# Patient Record
Sex: Female | Born: 1980 | Hispanic: Yes | Marital: Married | Smoking: Never smoker
Health system: Southern US, Community
[De-identification: ages and names within clinical notes are randomized; demographics above are authoritative.]

## PROBLEM LIST (undated history)

## (undated) DIAGNOSIS — R51 Headache: Secondary | ICD-10-CM

## (undated) DIAGNOSIS — K219 Gastro-esophageal reflux disease without esophagitis: Secondary | ICD-10-CM

## (undated) DIAGNOSIS — N83209 Unspecified ovarian cyst, unspecified side: Secondary | ICD-10-CM

## (undated) DIAGNOSIS — E785 Hyperlipidemia, unspecified: Secondary | ICD-10-CM

## (undated) DIAGNOSIS — T8859XA Other complications of anesthesia, initial encounter: Secondary | ICD-10-CM

## (undated) DIAGNOSIS — T4145XA Adverse effect of unspecified anesthetic, initial encounter: Secondary | ICD-10-CM

## (undated) HISTORY — DX: Hyperlipidemia, unspecified: E78.5

---

## 2007-09-20 HISTORY — PX: OTHER SURGICAL HISTORY: SHX169

## 2010-10-18 HISTORY — PX: ECTOPIC PREGNANCY SURGERY: SHX613

## 2010-10-18 HISTORY — PX: OTHER SURGICAL HISTORY: SHX169

## 2011-11-05 ENCOUNTER — Other Ambulatory Visit: Payer: Self-pay | Admitting: Specialist

## 2011-11-05 DIAGNOSIS — R102 Pelvic and perineal pain: Secondary | ICD-10-CM

## 2011-11-07 ENCOUNTER — Other Ambulatory Visit: Payer: Self-pay

## 2011-11-13 ENCOUNTER — Ambulatory Visit
Admission: RE | Admit: 2011-11-13 | Discharge: 2011-11-13 | Disposition: A | Discharge status: Home / Self Care | Payer: Medicaid Other | Source: Ambulatory Visit | Attending: Specialist | Admitting: Specialist

## 2011-11-13 DIAGNOSIS — R102 Pelvic and perineal pain: Secondary | ICD-10-CM

## 2012-01-02 ENCOUNTER — Encounter (INDEPENDENT_AMBULATORY_CARE_PROVIDER_SITE_OTHER): Payer: Self-pay

## 2012-01-14 ENCOUNTER — Other Ambulatory Visit (INDEPENDENT_AMBULATORY_CARE_PROVIDER_SITE_OTHER): Payer: Self-pay | Admitting: Surgery

## 2012-01-14 ENCOUNTER — Encounter (INDEPENDENT_AMBULATORY_CARE_PROVIDER_SITE_OTHER): Payer: Self-pay | Admitting: Surgery

## 2012-01-14 ENCOUNTER — Ambulatory Visit (INDEPENDENT_AMBULATORY_CARE_PROVIDER_SITE_OTHER): Payer: Medicaid Other | Admitting: Surgery

## 2012-01-14 VITALS — BP 106/86 | HR 80 | Temp 98.6°F | Resp 14 | Ht 61.0 in | Wt 161.4 lb

## 2012-01-14 DIAGNOSIS — K432 Incisional hernia without obstruction or gangrene: Secondary | ICD-10-CM

## 2012-01-14 NOTE — Patient Instructions (Signed)
Hernia (Hernia) Una hernia ocurre cuando un rgano interno protruye a travs de un punto dbil de los msculos de la pared muscular abdominal (del vientre). Se producen con mayor frecuencia en la ingle y alrededor del ombligo. Generalmente puede volver a Museum/gallery exhibitions officer (reducirse). La mayor parte de las hernias tienden a Theme park manager con Museum/gallery conservator. Algunas hernias abdominales pueden atascarse en la abertura (hernias irreductibles o hernia encarcelada) y no pueden reducirse. Una hernia abdominal irreducible que est ligeramente apretada en la abertura, corre el riesgo de daar el flujo de sangre (hernia estrangulada). Una hernia estrangulada es una emergencia mdica. Debido al riesgo que se corre en caso de hernia irreducible o estrangulada, se recomienda la ciruga para repararla. CAUSAS  Levantar peso excesivo.   Mucha tos.   Tensin al ir de cuerpo.   Durante la ciruga abdominal se realiza un corte (incisin).  INSTRUCCIONES PARA EL CUIDADO DOMICILIARIO  No es necesario hacer reposo en cama. Puede continuar con sus actividades habituales.   Evite levantar peso (ms de 10 libras o 4,5 Kg) o hacer esfuerzos.   Tos con suavidad. Si actualmente usted fuma, es el momento de abandonar el hbito. Hasta el procedimiento quirrgico ms perfecto puede malograrse si se hace fuerza contnua para toser. Aunque su hernia no haya sido reparada, la tos puede agravar el problema.   No use nada apretado sobre la hernia. No trate de mantenerla adentro con un vendaje externo o un braguero. Puede lesionar el contenido abdominal si los aprieta dentro del saco de la hernia.   Consumir una dieta normal.   Evite la constipacin. Si hace mucha fuerza aumentar el tamao de la hernia y podr daarse la reparacin. Si no lo logra slo con la dieta, puede usar laxantes.  SOLICITE ATENCIN MDICA DE INMEDIATO SI:  Tiene fiebre.   Presenta un dolor abdominal cada vez ms intenso.   Si tiene Programme researcher, broadcasting/film/video  (nuseas) y vmitos.   La hernia se ha atascado fuera del abdomen, se ve descolorida, se siente dura o le duele.   Observa cambios en el hbito intestinal o en la hernia, lo que no es habitual en usted.   El dolor o la hinchazn alrededor de la hernia [place].   No puede volver a Electrical engineer hernia en su lugar ejerciendo una presin suave mientras se encuentra recostado.  EST SEGURO QUE:   Comprende las instrucciones para el alta mdica.   Controlar su enfermedad.   Solicitar atencin mdica de inmediato segn las indicaciones.  Document Released: 08/05/2005 Document Revised: 07/25/2011 [facility] Patient Information 2012 [place], [place]. Hernia, reparacin con laparoscopa (Hernia Repair with Laparoscope) Una hernia ocurre cuando un rgano interno protruye a travs de un punto dbil de los msculos de la pared muscular abdominal (del vientre). Se producen con mayor frecuencia en la ingle y alrededor del ombligo. Tambin pueden producirse a travs de una incisin (corte realizado por el cirujano) despus de una operacin abdominal. Las causas de hernia son:  Levantar objetos pesados.   Tos prolongada.   Hacer fuerza para mover el intestino.  Generalmente puede volver a Museum/gallery exhibitions officer (reducirse). La mayor parte de las hernias tienden a Theme park manager con Museum/gallery conservator. Se producen algunos problemas cuando alguna parte del contenido abdominal se atasca en la abertura y el flujo de sangre se bloquea o se ve dificultado (hernia encarcelada). Debido a Clinical biochemist, es necesario someterse a una ciruga para reparar la hernia. La hernia se reparar utilizando un laparoscopio La ciruga laparoscpica  es un tipo de [place] mnimamente invasora. No implica ningn corte quirrgico (incisin) en la piel. El laparoscopio es un dispositivo similar a un telescopio que utiliza varillas y lentes. Se conecta a una cmara de vdeo y a una fuente de luz, con la que el profesional puede ver claramente la zona  de la operacin. El instrumento se inserta por una abertura en la piel de  to  inch (5 mm or 10 mm), en ciertos puntos especficos. Se crea un espacio de visualizacin y manipulacin, insuflando una pequea cantidad de dixido de carbono en la cavidad abdominal. El abdomen es inflado (insuflado) como un globo. Esto eleve la pared abdominal por arriba de los rganos internos como si fuera una bveda. El dixido de carbono es un gas presente en el organismo humano y puede ser absorbido por los tejidos y eliminado a travs del sistema respiratorio. Una vez que se ha llevado a cabo la reparacin, las pequeas incisiones se cerrarn, ya sea con suturas (puntos) o grapas (igual a las que se usan para el papel, pero aqu se trata de [employer] bordes de la piel unidos). INFORME AL PROFESIONAL SOBRE LOS SIGUIENTES PUNTOS:  Alergias.   Medicamentos que toma, incluyendo hierbas, gotas oftlmicas, medicamentos de venta libre y cremas.   Uso de esteroides (por va oral o cremas).   Problemas anteriores debido a anestsicos o a Patent examiner.   Posibilidad de embarazo, si correspondiera.   Antecedentes de haber tenido cogulos sanguneos (tromboflebitis).   Antecedentes de hemorragias o problemas sanguneos.   Cirugas previas.   Otros problemas de [place].  ANTES DEL PROCEDIMIENTO La laparoscopa podr llevarse a cabo tanto en el hospital como en un consultorio. Podrn administrarle sedante suave que lo ayudar a relajarse antes y durante el procedimiento. Una vez en la sala de operaciones, le darn una anestesia general que lo har dormir (a menos que usted y el profesional elijan otro tipo de anestesia).  DESPUS DEL PROCEDIMIENTO Luego del procedimiento ser llevado a una sala de recuperacin. Segn el tipo de hernia que se haya reparado, deber quedarse en el hospital o se Higher education careers adviser a Charity fundraiser. Con este procedimiento sentir menos dolor y habr Market researcher. Esto por lo  general resulta en una recuperacin ms rpida y un menor riesgo de infeccin. INSTRUCCIONES PARA EL CUIDADO DOMICILIARIO  No es necesario hacer reposo en cama. Puede continuar con sus actividades normales pero evite levantar objetos pesados (ms de 5 kg) y el estreimiento.   Tosa con suavidad. Si es fumador, es mejor abandonar el hbito, ya que an despus de reparada, la hernia puede volver a aparecer con la tensin continua de la tos.   Evite conducir hasta que el cirujano lo autorice.   No hay restricciones en la dieta, a menos que le indiquen otra cosa.   TOME TODOS LOS MEDICAMENTOS TAL COMO SE LE INDIC.   Utilice los medicamentos de venta libre o de prescripcin para Chief Technology Officer, Environmental health practitioner o la [place], segn se lo indique el profesional que lo asiste.  SOLICITE ATENCIN MDICA SI:  Aumenta el dolor abdominal o en las incisiones.   La hemorragia en las incisiones es ms que una pequea [place].   Si se siente confundido o muy dbil.   Siente escalofros o la temperatura oral se eleva por encima de 102 F (38.9 C) en forma inexplicable.   Presenta enrojecimiento, hinchazn o aumento del dolor en la herida.   Aparece pus en la  herida.   Un olor ftido proviene de la herida o del vendaje.  SOLICITE ATENCIN MDICA DE INMEDIATO SI:  Aparece una erupcin cutnea.   Presenta dificultades respiratorias.   Tiene algn problema alrgico.  EST SEGURO QUE:   Comprende las instrucciones para el alta mdica.   Controlar su enfermedad.   Solicitar atencin mdica de inmediato segn las indicaciones.  Document Released: 08/05/2005 Document Revised: 07/25/2011 [facility] Patient Information 2012 [place], [place]. Reparacin de la hernia, cuidados posteriores a la ciruga (Hernia Repair, Care After) Por favor lea detenidamente las siguientes indicaciones. Estas instrucciones le darn informacin acerca de qu hacer al [employer] hospital. El profesional que la asiste podr darle  instrucciones especficas. Si tiene problemas o surgen preguntas luego de recibir el alta, por favor comunquese con su mdico. CUIDADOS EN EL HOGAR  Podr tener cambios al mover el intestino.   Usted puede tener materia fecal blanda o acuosas.   Puede ser que no pueda ir de cuerpo.   Los intestinos volvern lentamente a la normalidad.   Puede ser que no sienta deseos de comer. Si hay un alimento que le produce nuseas, no lo coma. Coma pequeas porciones 4 a 6 veces al Geophysical data processor de 3 comidas importantes.   No beba gaseosas. Esto le producir gases.   No beba alcohol.   No levante objetos que pesen ms de 10 libras (4,54 Kg.). Esto es aproximadamente el peso de un galn de [place] (3, 780 l).   No haga nada que le produzca cansancio durante por lo menos 6 semanas.   No moje la herida [employer].   Durante este tiempo puede tomar un bao con esponja.   Pasados los 2 [place] tomar [place]. Luego de la ducha, seque la herida con una toalla dando palmaditas. No la frote.   Para hombres: Es posible que le hayan entregado un suspensor (soporte escrotal) antes de salir del hospital. Esto mantiene el escroto y los testculos ms cerca de su cuerpo por lo tanto no hay presin sobre la herida. Use el suspensor hasta que su mdico le indique que no lo necesita ms.  SOLICITE AYUDA DE INMEDIATO SI:  La materia fecal es acuosa, o no puede ir de cuerpo durante ms de [employer].   Siente nuseas o vomita mas de 2  3 veces.   Tiene fiebre de 102 F (38.9 C) o ms.   Observa enrojecimiento o hinchazn alrededor de la herida.   Observa un lquido amarillento o verdoso, o pus que proviene de la herida.   Observa un bulto o protuberancia en la parte inferior del abdomen o cerca de su ingle.   Aparece una erupcin, dificultad para respirar o cualquier otro sntoma provocado por el medicamento que toma.  ASEGRESE QUE:   Comprende estas instrucciones.   Controlar su enfermedad.     Solicitar ayuda inmediatamente si no mejora o si empeora.  Document Released: 11/01/2008 Document Revised: 07/25/2011 [facility] Patient Information 2012 [place], [place].

## 2012-01-14 NOTE — Progress Notes (Signed)
Patient ID: Stoney Bang, female   DOB: 09-Oct-1980, 31 y.o.   MRN: [id number]  Chief Complaint  Patient presents with  . New Evaluation    New Pt. Eval Abd Wall hernia    HPI Melinda Baxter is a 31 y.o. female.   HPI Patient sent at the request of Dr. Jackelyn Knife due to incisional hernia. She has had it for 7 months. She is seeing her gynecologist for pelvic pain she was sent to discuss hernia repair since she may require laparoscopy to evaluate pelvic pain. She speaks little and wish but is accompanied by family who translates. No history of nausea vomiting. No history of constipation. The hernia slides and it slides out. Is located just to the right of her lower midline abdominal incision. It bulges when she coughs.  Past Medical History  Diagnosis Date  . Asthma   . Hyperlipidemia     Past Surgical History  Procedure Date  . Salpingo-ooprectomy of left side 09/2007  . Ectopic pregnancy surgery 10/2010  . Salpingo-oophrectomy of right side 10/2010    History reviewed. No pertinent family history.  Social History History  Substance Use Topics  . Smoking status: Never Smoker   . Smokeless tobacco: Never Used  . Alcohol Use: No    No Known Allergies  No current outpatient prescriptions on file.    Review of Systems Review of Systems  Constitutional: Negative for fever, chills and unexpected weight change.  HENT: Negative for hearing loss, congestion, sore throat, trouble swallowing and voice change.   Eyes: Negative for visual disturbance.  Respiratory: Negative for cough and wheezing.   Cardiovascular: Negative for chest pain, palpitations and leg swelling.  Gastrointestinal: Negative for nausea, vomiting, abdominal pain, diarrhea, constipation, blood in stool, abdominal distention and anal bleeding.  Genitourinary: Negative for hematuria, vaginal bleeding and difficulty urinating.  Musculoskeletal: Negative for arthralgias.  Skin: Negative for rash and  wound.  Neurological: Negative for seizures, syncope and headaches.  Hematological: Negative for adenopathy. Does not bruise/bleed easily.  Psychiatric/Behavioral: Negative for confusion.    Blood pressure 106/86, pulse 80, temperature 98.6 F (37 C), temperature source Temporal, resp. rate 14, height 5\' 1"  (1.549 m), weight 161 lb 6.4 oz (73.211 kg).  Physical Exam Physical Exam  Constitutional: She appears well-developed and well-nourished.  HENT:  Head: Normocephalic and atraumatic.  Eyes: EOM are normal. Pupils are equal, round, and reactive to light.  Neck: Normal range of motion. Neck supple.  Cardiovascular: Normal rate and regular rhythm.   Pulmonary/Chest: Effort normal and breath sounds normal.  Abdominal:    Musculoskeletal: Normal range of motion.  Neurological: She is alert.  Skin: Skin is warm and dry.  Psychiatric: She has a normal mood and affect. Her behavior is normal. Judgment and thought content normal.    Data Reviewed Pelvic Ultrasound  Uterus normal both ovaries absent   Assessment    Incisional hernia 3 cm  Pelvic pain    Plan    Recommend laparoscopic repair of incisional hernia. Pelvic laparoscopy can be performed by Dr. Jackelyn Knife. I tried to contact him today but could only leave a message therefore I will wait to hear back from him prior to scheduling.  The risk of hernia repair include bleeding,  Infection,   Recurrence of the hernia,  Mesh use, chronic pain,  Organ injury,  Bowel injury,  Bladder injury,   nerve injury with numbness around the incision,  Death,  and worsening of preexisting  medical problems.  The alternatives  to surgery have been discussed as well..  Long term expectations of both operative and non operative treatments have been discussed.   The patient agrees to proceed.       Kaimen Peine A. 01/14/2012, 11:59 AM

## 2012-02-12 ENCOUNTER — Encounter (HOSPITAL_COMMUNITY): Payer: Self-pay | Admitting: Pharmacy Technician

## 2012-02-13 ENCOUNTER — Encounter (HOSPITAL_COMMUNITY): Payer: Self-pay

## 2012-02-13 ENCOUNTER — Encounter (HOSPITAL_COMMUNITY)
Admission: RE | Admit: 2012-02-13 | Discharge: 2012-02-13 | Disposition: A | Discharge status: Home / Self Care | Payer: Medicaid Other | Source: Ambulatory Visit | Attending: Surgery | Admitting: Surgery

## 2012-02-13 ENCOUNTER — Other Ambulatory Visit: Payer: Self-pay

## 2012-02-13 ENCOUNTER — Ambulatory Visit (HOSPITAL_COMMUNITY)
Admission: RE | Admit: 2012-02-13 | Discharge: 2012-02-13 | Disposition: A | Discharge status: Home / Self Care | Payer: Medicaid Other | Source: Ambulatory Visit | Attending: Surgery | Admitting: Surgery

## 2012-02-13 DIAGNOSIS — Z01810 Encounter for preprocedural cardiovascular examination: Secondary | ICD-10-CM | POA: Insufficient documentation

## 2012-02-13 DIAGNOSIS — Z01812 Encounter for preprocedural laboratory examination: Secondary | ICD-10-CM | POA: Insufficient documentation

## 2012-02-13 DIAGNOSIS — M6289 Other specified disorders of muscle: Secondary | ICD-10-CM | POA: Insufficient documentation

## 2012-02-13 HISTORY — DX: Headache: R51

## 2012-02-13 HISTORY — DX: Unspecified ovarian cyst, unspecified side: N83.209

## 2012-02-13 HISTORY — DX: Other complications of anesthesia, initial encounter: T88.59XA

## 2012-02-13 HISTORY — DX: Gastro-esophageal reflux disease without esophagitis: K21.9

## 2012-02-13 HISTORY — DX: Adverse effect of unspecified anesthetic, initial encounter: T41.45XA

## 2012-02-13 LAB — COMPREHENSIVE METABOLIC PANEL
AST: 15 U/L (ref 0–37)
Albumin: 4 g/dL (ref 3.5–5.2)
BUN: 13 mg/dL (ref 6–23)
Calcium: 9.9 mg/dL (ref 8.4–10.5)
Creatinine, Ser: 0.45 mg/dL — ABNORMAL LOW (ref 0.50–1.10)
Total Bilirubin: 0.2 mg/dL — ABNORMAL LOW (ref 0.3–1.2)
Total Protein: 7.6 g/dL (ref 6.0–8.3)

## 2012-02-13 LAB — DIFFERENTIAL
Basophils Relative: 0 % (ref 0–1)
Lymphocytes Relative: 36 % (ref 12–46)
Lymphs Abs: 2.4 10*3/uL (ref 0.7–4.0)
Monocytes Absolute: 0.6 10*3/uL (ref 0.1–1.0)
Monocytes Relative: 9 % (ref 3–12)
Neutro Abs: 3.5 10*3/uL (ref 1.7–7.7)
Neutrophils Relative %: 52 % (ref 43–77)

## 2012-02-13 LAB — CBC
HCT: 37 % (ref 36.0–46.0)
MCH: 29.7 pg (ref 26.0–34.0)
MCHC: 34.3 g/dL (ref 30.0–36.0)
MCV: 86.4 fL (ref 78.0–100.0)
Platelets: 173 10*3/uL (ref 150–400)
RDW: 12.6 % (ref 11.5–15.5)
WBC: 6.7 10*3/uL (ref 4.0–10.5)

## 2012-02-13 LAB — HCG, SERUM, QUALITATIVE: Preg, Serum: NEGATIVE

## 2012-02-13 LAB — SURGICAL PCR SCREEN: Staphylococcus aureus: NEGATIVE

## 2012-02-13 NOTE — Patient Instructions (Addendum)
20 Melinda Baxter  02/13/2012   Your procedure is scheduled on:  02/24/12 0730am-0930am  Report to [facility] at 0530 AM.  Call this number if you have problems the morning of surgery: [phone]   Remember:   Do not eat food:After Midnight.  May have clear liquids:until Midnight .  Marland Kitchen  Take these medicines the morning of surgery with A SIP OF WATER:   Do not wear jewelry, make-up or nail polish.  Do not wear lotions, powders, or perfumes..  Do not shave 48 hours prior to surgery.  Do not bring valuables to the hospital.  Contacts, dentures or bridgework may not be worn into surgery.     Patients discharged the day of surgery will not be allowed to drive home.  Name and phone number of your driver:   Special Instructions: CHG Shower Use Special Wash: 1/2 bottle night before surgery and 1/2 bottle morning of surgery. shower chin to toes with CHG.  Wash face and private parts with regular soap.     Please read over the following fact sheets that you were given: MRSA Information, coughign and deep breathing exercises, leg exercises

## 2012-02-13 NOTE — Pre-Procedure Instructions (Signed)
Teach Back method used for teaching for preop appointment

## 2012-02-14 ENCOUNTER — Other Ambulatory Visit: Payer: Self-pay | Admitting: Obstetrics and Gynecology

## 2012-02-21 NOTE — Progress Notes (Signed)
Spoke with Melinda Baxter re: /dr Meisinger has put in orders in for consent for procedure but this procedure has not been booked with OR. Called and left a message at Office for them to call us back ASAP.  Melinda Baxter her recommendation is to get consent signed for this procedure per physician order.

## 2012-02-24 ENCOUNTER — Encounter (HOSPITAL_COMMUNITY): Payer: Self-pay | Admitting: Certified Registered Nurse Anesthetist

## 2012-02-24 ENCOUNTER — Encounter (HOSPITAL_COMMUNITY): Payer: Self-pay | Admitting: Surgery

## 2012-02-24 ENCOUNTER — Ambulatory Visit (HOSPITAL_COMMUNITY): Payer: Medicaid Other | Admitting: Certified Registered Nurse Anesthetist

## 2012-02-24 ENCOUNTER — Encounter (HOSPITAL_COMMUNITY): Payer: Self-pay | Admitting: *Deleted

## 2012-02-24 ENCOUNTER — Encounter (HOSPITAL_COMMUNITY)
Admission: RE | Disposition: A | Discharge status: Home / Self Care | Payer: Self-pay | Source: Ambulatory Visit | Attending: Surgery

## 2012-02-24 ENCOUNTER — Inpatient Hospital Stay (HOSPITAL_COMMUNITY)
Admission: RE | Admit: 2012-02-24 | Discharge: 2012-02-27 | DRG: 337 | Disposition: A | Discharge status: Home / Self Care | Payer: Medicaid Other | Source: Ambulatory Visit | Attending: Surgery | Admitting: Surgery

## 2012-02-24 DIAGNOSIS — K432 Incisional hernia without obstruction or gangrene: Principal | ICD-10-CM | POA: Diagnosis present

## 2012-02-24 DIAGNOSIS — Z9889 Other specified postprocedural states: Secondary | ICD-10-CM

## 2012-02-24 DIAGNOSIS — N949 Unspecified condition associated with female genital organs and menstrual cycle: Secondary | ICD-10-CM | POA: Diagnosis present

## 2012-02-24 DIAGNOSIS — N736 Female pelvic peritoneal adhesions (postinfective): Secondary | ICD-10-CM | POA: Diagnosis present

## 2012-02-24 DIAGNOSIS — Z9079 Acquired absence of other genital organ(s): Secondary | ICD-10-CM

## 2012-02-24 HISTORY — PX: INCISIONAL HERNIA REPAIR: SHX193

## 2012-02-24 HISTORY — PX: LAPAROSCOPY: SHX197

## 2012-02-24 HISTORY — PX: HERNIA REPAIR: SHX51

## 2012-02-24 SURGERY — REPAIR, HERNIA, INCISIONAL, LAPAROSCOPIC
Anesthesia: General | Site: Abdomen | Wound class: Clean Contaminated

## 2012-02-24 MED ORDER — PROPOFOL 10 MG/ML IV EMUL
INTRAVENOUS | Status: DC | PRN
  Administered 2012-02-24: 150 mg via INTRAVENOUS

## 2012-02-24 MED ORDER — LACTATED RINGERS IV SOLN
INTRAVENOUS | Status: DC | PRN
  Administered 2012-02-24 (×2): via INTRAVENOUS

## 2012-02-24 MED ORDER — ENOXAPARIN SODIUM 40 MG/0.4ML ~~LOC~~ SOLN
40.0000 mg | SUBCUTANEOUS | Status: DC
  Administered 2012-02-25 – 2012-02-27 (×3): 40 mg via SUBCUTANEOUS
  Filled 2012-02-24 (×4): qty 0.4

## 2012-02-24 MED ORDER — GLYCOPYRROLATE 0.2 MG/ML IJ SOLN
INTRAMUSCULAR | Status: DC | PRN
  Administered 2012-02-24: 0.6 mg via INTRAVENOUS

## 2012-02-24 MED ORDER — CHLORHEXIDINE GLUCONATE 4 % EX LIQD
1.0000 "application " | Freq: Once | CUTANEOUS | Status: DC
  Filled 2012-02-24: qty 15

## 2012-02-24 MED ORDER — ACETAMINOPHEN 10 MG/ML IV SOLN
INTRAVENOUS | Status: AC
  Filled 2012-02-24: qty 100

## 2012-02-24 MED ORDER — ONDANSETRON HCL 4 MG/2ML IJ SOLN
4.0000 mg | Freq: Four times a day (QID) | INTRAMUSCULAR | Status: DC | PRN
  Administered 2012-02-25: 4 mg via INTRAVENOUS
  Filled 2012-02-24: qty 2

## 2012-02-24 MED ORDER — NALOXONE HCL 0.4 MG/ML IJ SOLN: 0.4000 mg | INTRAMUSCULAR | Status: DC | PRN

## 2012-02-24 MED ORDER — ONDANSETRON HCL 4 MG/2ML IJ SOLN
INTRAMUSCULAR | Status: DC | PRN
  Administered 2012-02-24: 4 mg via INTRAVENOUS

## 2012-02-24 MED ORDER — STERILE WATER FOR IRRIGATION IR SOLN
Status: DC | PRN
  Administered 2012-02-24: 1500 mL

## 2012-02-24 MED ORDER — HYDROMORPHONE HCL PF 1 MG/ML IJ SOLN
INTRAMUSCULAR | Status: AC
  Filled 2012-02-24: qty 1

## 2012-02-24 MED ORDER — ROCURONIUM BROMIDE 100 MG/10ML IV SOLN
INTRAVENOUS | Status: DC | PRN
  Administered 2012-02-24: 5 mg via INTRAVENOUS
  Administered 2012-02-24: 25 mg via INTRAVENOUS

## 2012-02-24 MED ORDER — ONDANSETRON HCL 4 MG PO TABS: 4.0000 mg | ORAL_TABLET | Freq: Four times a day (QID) | ORAL | Status: DC | PRN

## 2012-02-24 MED ORDER — NEOSTIGMINE METHYLSULFATE 1 MG/ML IJ SOLN
INTRAMUSCULAR | Status: DC | PRN
  Administered 2012-02-24: 5 mg via INTRAVENOUS

## 2012-02-24 MED ORDER — CEFAZOLIN SODIUM-DEXTROSE 2-3 GM-% IV SOLR
INTRAVENOUS | Status: AC
  Filled 2012-02-24: qty 50

## 2012-02-24 MED ORDER — BUPIVACAINE-EPINEPHRINE PF 0.25-1:200000 % IJ SOLN
INTRAMUSCULAR | Status: AC
  Filled 2012-02-24: qty 30

## 2012-02-24 MED ORDER — ACETAMINOPHEN 10 MG/ML IV SOLN
INTRAVENOUS | Status: DC | PRN
  Administered 2012-02-24: 1000 mg via INTRAVENOUS

## 2012-02-24 MED ORDER — KETOROLAC TROMETHAMINE 15 MG/ML IJ SOLN
15.0000 mg | Freq: Four times a day (QID) | INTRAMUSCULAR | Status: DC | PRN
  Administered 2012-02-25 – 2012-02-26 (×4): 15 mg via INTRAVENOUS
  Filled 2012-02-24 (×5): qty 1

## 2012-02-24 MED ORDER — SUCCINYLCHOLINE CHLORIDE 20 MG/ML IJ SOLN
INTRAMUSCULAR | Status: DC | PRN
  Administered 2012-02-24: 100 mg via INTRAVENOUS

## 2012-02-24 MED ORDER — CEFAZOLIN SODIUM-DEXTROSE 2-3 GM-% IV SOLR
2.0000 g | INTRAVENOUS | Status: AC
  Administered 2012-02-24: 2 g via INTRAVENOUS

## 2012-02-24 MED ORDER — DIPHENHYDRAMINE HCL 50 MG/ML IJ SOLN: 12.5000 mg | Freq: Four times a day (QID) | INTRAMUSCULAR | Status: DC | PRN

## 2012-02-24 MED ORDER — HYDROMORPHONE HCL PF 1 MG/ML IJ SOLN
0.2500 mg | INTRAMUSCULAR | Status: DC | PRN
Start: 2012-02-24 — End: 2012-02-24
  Administered 2012-02-24 (×4): 0.5 mg via INTRAVENOUS

## 2012-02-24 MED ORDER — PHENYLEPHRINE HCL 10 MG/ML IJ SOLN
INTRAMUSCULAR | Status: DC | PRN
  Administered 2012-02-24: 120 ug via INTRAVENOUS

## 2012-02-24 MED ORDER — BUPIVACAINE-EPINEPHRINE PF 0.25-1:200000 % IJ SOLN
INTRAMUSCULAR | Status: DC | PRN
  Administered 2012-02-24: 20 mL

## 2012-02-24 MED ORDER — MIDAZOLAM HCL 5 MG/5ML IJ SOLN
INTRAMUSCULAR | Status: DC | PRN
  Administered 2012-02-24: 2 mg via INTRAVENOUS

## 2012-02-24 MED ORDER — HYDROMORPHONE 0.3 MG/ML IV SOLN
INTRAVENOUS | Status: DC
  Administered 2012-02-24: 0.9 mg via INTRAVENOUS
  Administered 2012-02-24: 7.17 mg via INTRAVENOUS
  Administered 2012-02-24: 3 mg via INTRAVENOUS
  Administered 2012-02-24 – 2012-02-25 (×2): via INTRAVENOUS
  Administered 2012-02-25: 15.3 mg via INTRAVENOUS
  Administered 2012-02-25: 3.9 mg via INTRAVENOUS
  Administered 2012-02-25: 5.1 mg via INTRAVENOUS
  Administered 2012-02-25: 06:00:00 via INTRAVENOUS
  Administered 2012-02-25: 2.7 mg via INTRAVENOUS
  Administered 2012-02-25: 14:00:00 via INTRAVENOUS
  Administered 2012-02-26: 24 mg via INTRAVENOUS
  Administered 2012-02-26: 0.6 mg via INTRAVENOUS
  Administered 2012-02-26: 0.9 mg via INTRAVENOUS
  Filled 2012-02-24 (×4): qty 25

## 2012-02-24 MED ORDER — KCL IN DEXTROSE-NACL 20-5-0.45 MEQ/L-%-% IV SOLN
INTRAVENOUS | Status: AC
  Administered 2012-02-24: 11:00:00
  Filled 2012-02-24: qty 1000

## 2012-02-24 MED ORDER — OXYCODONE-ACETAMINOPHEN 5-325 MG PO TABS
1.0000 | ORAL_TABLET | ORAL | Status: DC | PRN
  Administered 2012-02-26 – 2012-02-27 (×3): 2 via ORAL
  Filled 2012-02-24: qty 1
  Filled 2012-02-24 (×3): qty 2

## 2012-02-24 MED ORDER — FENTANYL CITRATE 0.05 MG/ML IJ SOLN
INTRAMUSCULAR | Status: DC | PRN
  Administered 2012-02-24 (×2): 50 ug via INTRAVENOUS
  Administered 2012-02-24: 100 ug via INTRAVENOUS
  Administered 2012-02-24: 50 ug via INTRAVENOUS

## 2012-02-24 MED ORDER — LIDOCAINE HCL (CARDIAC) 20 MG/ML IV SOLN
INTRAVENOUS | Status: DC | PRN
  Administered 2012-02-24: 50 mg via INTRAVENOUS

## 2012-02-24 MED ORDER — KCL IN DEXTROSE-NACL 20-5-0.45 MEQ/L-%-% IV SOLN
INTRAVENOUS | Status: DC
  Administered 2012-02-24 – 2012-02-25 (×4): via INTRAVENOUS
  Filled 2012-02-24 (×4): qty 1000

## 2012-02-24 MED ORDER — DEXAMETHASONE SODIUM PHOSPHATE 10 MG/ML IJ SOLN
INTRAMUSCULAR | Status: DC | PRN
  Administered 2012-02-24: 10 mg via INTRAVENOUS

## 2012-02-24 MED ORDER — DIPHENHYDRAMINE HCL 12.5 MG/5ML PO ELIX
12.5000 mg | ORAL_SOLUTION | Freq: Four times a day (QID) | ORAL | Status: DC | PRN
  Administered 2012-02-25: 12.5 mg via ORAL
  Filled 2012-02-24: qty 5

## 2012-02-24 MED ORDER — HYDROMORPHONE 0.3 MG/ML IV SOLN
INTRAVENOUS | Status: AC
  Administered 2012-02-24: 20:00:00
  Filled 2012-02-24: qty 25

## 2012-02-24 MED ORDER — 0.9 % SODIUM CHLORIDE (POUR BTL) OPTIME
TOPICAL | Status: DC | PRN
  Administered 2012-02-24: 1000 mL

## 2012-02-24 MED ORDER — SODIUM CHLORIDE 0.9 % IJ SOLN: 9.0000 mL | INTRAMUSCULAR | Status: DC | PRN

## 2012-02-24 MED ORDER — ONDANSETRON HCL 4 MG/2ML IJ SOLN: 4.0000 mg | Freq: Four times a day (QID) | INTRAMUSCULAR | Status: DC | PRN

## 2012-02-24 SURGICAL SUPPLY — 67 items
APPLIER CLIP 5 13 M/L LIGAMAX5 (MISCELLANEOUS)
BINDER ABD UNIV 12 45-62 (WOUND CARE) IMPLANT
BINDER ABDOMINAL 46IN 62IN (WOUND CARE)
CABLE HIGH FREQUENCY MONO STRZ (ELECTRODE) ×2 IMPLANT
CANISTER SUCTION 2500CC (MISCELLANEOUS) IMPLANT
CATH FOLEY 2WAY  3CC 10FR (CATHETERS)
CATH FOLEY 2WAY 3CC 10FR (CATHETERS) IMPLANT
CATH ROBINSON RED A/P 16FR (CATHETERS) IMPLANT
CHLORAPREP W/TINT 26ML (MISCELLANEOUS) ×2 IMPLANT
CLIP APPLIE 5 13 M/L LIGAMAX5 (MISCELLANEOUS) IMPLANT
CLOTH BEACON ORANGE TIMEOUT ST (SAFETY) ×2 IMPLANT
DECANTER SPIKE VIAL GLASS SM (MISCELLANEOUS) ×4 IMPLANT
DERMABOND ADVANCED (GAUZE/BANDAGES/DRESSINGS) ×2
DERMABOND ADVANCED .7 DNX12 (GAUZE/BANDAGES/DRESSINGS) ×2 IMPLANT
DEVICE SECURE STRAP 25 ABSORB (INSTRUMENTS) ×6 IMPLANT
DEVICE TROCAR PUNCTURE CLOSURE (ENDOMECHANICALS) ×2 IMPLANT
DRAIN CHANNEL RND F F (WOUND CARE) IMPLANT
DRAPE INCISE IOBAN 66X45 STRL (DRAPES) ×2 IMPLANT
DRAPE LAPAROSCOPIC ABDOMINAL (DRAPES) ×2 IMPLANT
DRAPE WARM FLUID 44X44 (DRAPE) ×2 IMPLANT
ELECT REM PT RETURN 9FT ADLT (ELECTROSURGICAL) ×2
ELECTRODE REM PT RTRN 9FT ADLT (ELECTROSURGICAL) ×1 IMPLANT
EVACUATOR SILICONE 100CC (DRAIN) IMPLANT
GLOVE BIO SURGEON STRL SZ8 (GLOVE) IMPLANT
GLOVE BIOGEL PI IND STRL 7.0 (GLOVE) ×1 IMPLANT
GLOVE BIOGEL PI INDICATOR 7.0 (GLOVE) ×1
GLOVE INDICATOR 8.0 STRL GRN (GLOVE) ×4 IMPLANT
GLOVE ORTHO TXT STRL SZ7.5 (GLOVE) ×4 IMPLANT
GLOVE SS BIOGEL STRL SZ 8 (GLOVE) ×1 IMPLANT
GLOVE SUPERSENSE BIOGEL SZ 8 (GLOVE) ×1
GOWN PREVENTION PLUS LG XLONG (DISPOSABLE) ×2 IMPLANT
GOWN STRL NON-REIN LRG LVL3 (GOWN DISPOSABLE) ×2 IMPLANT
GOWN STRL REIN XL XLG (GOWN DISPOSABLE) ×4 IMPLANT
HAND ACTIVATED (MISCELLANEOUS) ×2 IMPLANT
KIT BASIN OR (CUSTOM PROCEDURE TRAY) ×2 IMPLANT
MESH PARIETEX 25X20 (Mesh General) ×2 IMPLANT
NEEDLE INSUFFLATION 14GA 120MM (NEEDLE) IMPLANT
NEEDLE SPNL 22GX3.5 QUINCKE BK (NEEDLE) ×4 IMPLANT
NS IRRIG 1000ML POUR BTL (IV SOLUTION) ×2 IMPLANT
PACK LAPAROSCOPY W LONG (CUSTOM PROCEDURE TRAY) IMPLANT
PEN SKIN MARKING BROAD (MISCELLANEOUS) ×2 IMPLANT
PENCIL BUTTON HOLSTER BLD 10FT (ELECTRODE) ×2 IMPLANT
PROTECTOR NERVE ULNAR (MISCELLANEOUS) ×2 IMPLANT
SCALPEL HARMONIC ACE (MISCELLANEOUS) IMPLANT
SET IRRIG TUBING LAPAROSCOPIC (IRRIGATION / IRRIGATOR) IMPLANT
SLEEVE Z-THREAD 5X100MM (TROCAR) IMPLANT
SOLUTION ELECTROLUBE (MISCELLANEOUS) IMPLANT
SPONGE GAUZE 4X4 12PLY (GAUZE/BANDAGES/DRESSINGS) ×2 IMPLANT
STAPLER VISISTAT 35W (STAPLE) ×2 IMPLANT
SUT MNCRL AB 4-0 PS2 18 (SUTURE) ×2 IMPLANT
SUT NOVA NAB DX-16 0-1 5-0 T12 (SUTURE) ×4 IMPLANT
SUT PDS AB 1 CTX 36 (SUTURE) ×10 IMPLANT
SUT VIC AB 4-0 P2 18 (SUTURE) ×2 IMPLANT
SUT VICRYL 0 UR6 27IN ABS (SUTURE) IMPLANT
TACKER 5MM HERNIA 3.5CML NAB (ENDOMECHANICALS) IMPLANT
TAPE CLOTH SURG 4X10 WHT LF (GAUZE/BANDAGES/DRESSINGS) ×2 IMPLANT
TOWEL OR 17X26 10 PK STRL BLUE (TOWEL DISPOSABLE) ×4 IMPLANT
TRAY FOLEY CATH 14FRSI W/METER (CATHETERS) ×2 IMPLANT
TRAY LAP CHOLE (CUSTOM PROCEDURE TRAY) ×2 IMPLANT
TROCAR BLADELESS OPT 5 75 (ENDOMECHANICALS) ×8 IMPLANT
TROCAR XCEL BLUNT TIP 100MML (ENDOMECHANICALS) ×2 IMPLANT
TROCAR XCEL NON-BLD 11X100MML (ENDOMECHANICALS) ×2 IMPLANT
TROCAR Z-THREAD FIOS 11X100 BL (TROCAR) IMPLANT
TROCAR Z-THREAD FIOS 5X100MM (TROCAR) ×2 IMPLANT
TUBING FILTER THERMOFLATOR (ELECTROSURGICAL) ×2 IMPLANT
WARMER LAPAROSCOPE (MISCELLANEOUS) IMPLANT
WATER STERILE IRR 1000ML POUR (IV SOLUTION) ×2 IMPLANT

## 2012-02-24 NOTE — Op Note (Signed)
Preoperative diagnosis: Incisional hernia  Postoperative diagnosis: 8 x 8 cm incisional hernia  Procedure: Laparoscopic repair of incisional hernia with 25 cm x 20 cm parietex dual mesh  Surgeon: Harriette Bouillon M.D.  Co-surgeon: Dr. Jackelyn Knife M.D.  Anesthesia: Gen. Endotracheal anesthesia with 0.25% Sensorcaine local with epinephrine  Drains: None  Specimens: None  EBL: 100 cc  IV fluids: 2500 cc crystalloid  Indications for procedure: The patient is a 31 year old female who had a previous laparotomy in [place] 10 years ago. She developed an incisional hernia and had chronic pelvic pain. Dr. Jackelyn Knife which will evaluate her pelvis during her laparoscopic hernia repair. Risks, benefits and alternative therapies were discussed with the patient with the help of an interpreter at the bedside and in the office. Risk of bleeding, infection, organ injury, major vascular injury, palate injury, hernia recurrence, injury to the intestine, wound infection, blood clots, death and potential reoperation discussed. Patient agreed to proceed.  Description of procedure: The patient was met in the holding area and questions were answered with the help of an interpreter. She's and taken back to the operating room and placed supine. General anesthesia initiated and she was placed in lithotomy after placement of Foley catheter under sterile conditions. Abdomen and perineum were prepped and draped in a sterile fashion. Timeout was done and she received 2 g of Ancef. 5 mm Optiview port was used to gain access to the abdominal cavity small right upper quarter incision. It was passed to the abdominal wall easily and all layers were visualized. Pneumoperitoneum was established to 15 mm of mercury of carbon dioxide and there is no evidence of injury with insertion of this port upon inspection. 2 other 5 mm ports are placed in the upper midline and left upper quadrant under direct vision. There were some adhesions to  her lower midline incision these were taken down easily with sharp dissection. Hernia defect was noted there and was roughly 8 cm in maximal diameter in the lower abdominal midline. Dr. Jackelyn Knife then examined the uterus and fallopian tubes. She had a history of previous oophorectomy in [place]. Please see his note for details of this portion the procedure. We then chose a 25 cm x 20 cm piece of parietex dual mesh. This was soaked in saline in 4 stay sutures were placed. A small lower midline incision was used to the hernia defect to gain access to the abdominal wall. We placed the mesh through this and then reapproximated the fascia with #1 PDS suture. Using a suture passer I placed 3 additional sutures to reapproximate the midline fascia. Harmonic scalpel was used to open the space just below the hernia in the peritoneum down to the pubis. The bladder was well away from this. We then used a suture passer and pulled all 4 stay sutures up to the abdominal wall under direct vision. The most inferior most pole just adjacent to the pubis. A tacking device using absorbable tacks was used to secure the mesh circumferentially. The peritoneal flap was created inferiorly was pulled up intact to the mesh. I placed additional 4 Novafil sutures through the mesh under direct vision using a suture passer in between the previous for tacking sutures. There is minimal redundancy of the mesh and it was well secured. Inspection the abdominal cavity revealed no injury to the small intestine, colon, bladder or iliac vessels bilaterally. At this portion the case, we allowed the CO2 to  escape and removed the 5 mm ports. Skin incisions were closed with  staples. Foley catheter was left in place and the urine was clear. She was taken to lithotomy after placement of dry sterile dressings are incisions. She was extubated and taken recovery in satisfactory condition. All final counts are found to be correct.

## 2012-02-24 NOTE — Op Note (Addendum)
Preoperative diagnosis: Pelvic pain Postoperative diagnosis: Pelvic pain and pelvic adhesions Procedure: Laparoscopic adhesiolysis Surgeon: Lavina Hamman M.D. Assistant: Harriette Bouillon M.D. Findings:  Adhesions to the left posterior uterus and left adnexa, absent bilateral tubes and ovaries Estimated blood loss: Minimal  Procedure in detail:  Dr. Luisa Hart had previously inserted 3 5 mm ports for laparoscopy. The patient was in the Trendelenburg position. Dr. Luisa Hart had previously taken down omental adhesions to her ventral hernia. Inspection of the pelvis revealed the above mentioned findings. Using a grasper and scissors I was able to free all of the adhesions to the posterior uterus and the left adnexa. Again no other pathology was identified. No significant ovarian remnant was identified on either side.  There was noted to be some permanent suture in what I believe is the remnant of the left infundipulopelvic ligament. No other obvious source for her pelvic pain was identified. At this point I left the field and Dr. Luisa Hart continued with his ventral hernia repair.

## 2012-02-24 NOTE — Anesthesia Postprocedure Evaluation (Signed)
  Anesthesia Post-op Note  Patient: Melinda Baxter  Procedure(s) Performed: Procedure(s) (LRB): LAPAROSCOPIC INCISIONAL HERNIA (N/A) INSERTION OF MESH (N/A) LAPAROSCOPY DIAGNOSTIC (N/A) LAPAROSCOPIC LYSIS OF ADHESIONS (N/A)  Patient Location: PACU  Anesthesia Type: General  Level of Consciousness: oriented and sedated  Airway and Oxygen Therapy: Patient Spontanous Breathing and Patient connected to nasal cannula oxygen  Post-op Pain: mild  Post-op Assessment: Post-op Vital signs reviewed, Patient's Cardiovascular Status Stable, Respiratory Function Stable and Patent Airway  Post-op Vital Signs: stable  Complications: No apparent anesthesia complications

## 2012-02-24 NOTE — Anesthesia Preprocedure Evaluation (Signed)
Anesthesia Evaluation  Patient identified by MRN, date of birth, ID band Patient awake  General Assessment Comment:Hx thru interpreter  Reviewed: Allergy & Precautions, H&P , NPO status , Patient's Chart, lab work & pertinent test results, reviewed documented beta blocker date and time   Airway Mallampati: I TM Distance: >3 FB Neck ROM: Full    Dental  (+) Teeth Intact and Dental Advisory Given   Pulmonary neg pulmonary ROS,  breath sounds clear to auscultation        Cardiovascular negative cardio ROS  Rhythm:Regular Rate:Normal     Neuro/Psych negative neurological ROS  negative psych ROS   GI/Hepatic negative GI ROS, Neg liver ROS,   Endo/Other  negative endocrine ROS  Renal/GU negative Renal ROS  negative genitourinary   Musculoskeletal negative musculoskeletal ROS (+)   Abdominal   Peds negative pediatric ROS (+)  Hematology negative hematology ROS (+)   Anesthesia Other Findings   Reproductive/Obstetrics negative OB ROS                           Anesthesia Physical Anesthesia Plan  ASA: I  Anesthesia Plan: General   Post-op Pain Management:    Induction: Intravenous  Airway Management Planned: Oral ETT  Additional Equipment:   Intra-op Plan:   Post-operative Plan: Extubation in OR  Informed Consent: I have reviewed the patients History and Physical, chart, labs and discussed the procedure including the risks, benefits and alternatives for the proposed anesthesia with the patient or authorized representative who has indicated his/her understanding and acceptance.   Dental advisory given  Plan Discussed with: CRNA and Surgeon  Anesthesia Plan Comments:         Anesthesia Quick Evaluation

## 2012-02-24 NOTE — H&P (Signed)
Melinda Baxter    MRN: [id number]   Description: 30 year old female  Provider: Dortha Schwalbe., MD  Department: [facility]        Diagnoses     Incisional hernia   - Primary    553.21      Reason for Visit     New Evaluation    New Pt. Eval Abd Wall hernia        Vitals - Last Recorded       BP Pulse Temp Resp Ht Wt    106/86 80 98.6 F (37 C) (Temporal) 14 5\' 1"  (1.549 m) 161 lb 6.4 oz (73.211 kg)         BMI              30.50 kg/m2                 Progress Notes    Patient ID: Melinda Baxter, female   DOB: 09-Apr-1981, 31 y.o.   MRN: [id number]    Chief Complaint   Patient presents with   .  New Evaluation       New Pt. Eval Abd Wall hernia      HPI Melinda Baxter is a 31 y.o. female.   HPI Patient sent at the request of Dr. Jackelyn Knife due to incisional hernia. She has had it for 7 months. She is seeing her gynecologist for pelvic pain she was sent to discuss hernia repair since she may require laparoscopy to evaluate pelvic pain. She speaks little and wish but is accompanied by family who translates. No history of nausea vomiting. No history of constipation. The hernia slides and it slides out. Is located just to the right of her lower midline abdominal incision. It bulges when she coughs.    Past Medical History   Diagnosis  Date   .  Asthma     .  Hyperlipidemia         Past Surgical History   Procedure  Date   .  Salpingo-ooprectomy of left side  09/2007   .  Ectopic pregnancy surgery  10/2010   .  Salpingo-oophrectomy of right side  10/2010      History reviewed. No pertinent family history.   Social History History   Substance Use Topics   .  Smoking status:  Never Smoker    .  Smokeless tobacco:  Never Used   .  Alcohol Use:  No      No Known Allergies    No current outpatient prescriptions on file.      Review of Systems Review of Systems  Constitutional: Negative for fever, chills and  unexpected weight change.  HENT: Negative for hearing loss, congestion, sore throat, trouble swallowing and voice change.   Eyes: Negative for visual disturbance.  Respiratory: Negative for cough and wheezing.   Cardiovascular: Negative for chest pain, palpitations and leg swelling.  Gastrointestinal: Negative for nausea, vomiting, abdominal pain, diarrhea, constipation, blood in stool, abdominal distention and anal bleeding.  Genitourinary: Negative for hematuria, vaginal bleeding and difficulty urinating.  Musculoskeletal: Negative for arthralgias.  Skin: Negative for rash and wound.  Neurological: Negative for seizures, syncope and headaches.  Hematological: Negative for adenopathy. Does not bruise/bleed easily.  Psychiatric/Behavioral: Negative for confusion.    Blood pressure 106/86, pulse 80, temperature 98.6 F (37 C), temperature source Temporal, resp. rate 14, height 5\' 1"  (1.549 m), weight 161 lb 6.4 oz (73.211 kg).   Physical Exam Physical Exam  Constitutional: She appears well-developed and well-nourished.  HENT:   Head: Normocephalic and atraumatic.  Eyes: EOM are normal. Pupils are equal, round, and reactive to light.  Neck: Normal range of motion. Neck supple.  Cardiovascular: Normal rate and regular rhythm.   Pulmonary/Chest: Effort normal and breath sounds normal.  Abdominal:  Fascial defect lower abdominal wall incision   Musculoskeletal: Normal range of motion.  Neurological: She is alert.  Skin: Skin is warm and dry.  Psychiatric: She has a normal mood and affect. Her behavior is normal. Judgment and thought content normal.    Data Reviewed Pelvic Ultrasound  Uterus normal both ovaries absent    Assessment Incisional hernia 3 cm   Pelvic pain   Plan   Recommend laparoscopic repair of incisional hernia. Pelvic laparoscopy can be performed by Dr. Jackelyn Knife. I tried to contact him today but could only leave a message therefore I will wait to hear back  from him prior to scheduling.  The risk of hernia repair include bleeding,  Infection,   Recurrence of the hernia,  Mesh use, chronic pain,  Organ injury,  Bowel injury,  Bladder injury,   nerve injury with numbness around the incision,  Death,  and worsening of preexisting  medical problems.  The alternatives to surgery have been discussed as well..  Long term expectations of both operative and non operative treatments have been discussed.   The patient agrees to proceed.       Roquel Burgin A. 02/24/2012

## 2012-02-24 NOTE — Interval H&P Note (Signed)
History and Physical Interval Note:  02/24/2012 7:24 AM  Melinda Baxter  has presented today for surgery, with the diagnosis of incisional hernia  The various methods of treatment have been discussed with the patient and family. After consideration of risks, benefits and other options for treatment, the patient has consented to  Procedure(s) (LRB): LAPAROSCOPIC INCISIONAL HERNIA (N/A) INSERTION OF MESH (N/A) LAPAROSCOPY DIAGNOSTIC (N/A) as a surgical intervention .  The patient's history has been reviewed, patient examined, no change in status, stable for surgery.  I have reviewed the patients' chart and labs.  Questions were answered to the patient's satisfaction.     Marquon Alcala A.

## 2012-02-24 NOTE — Progress Notes (Signed)
I will be inspecting the pelvis with the laparoscope once Dr. Luisa Hart has done the ventral hernia repair.  The patient is aware of the procedure, risks, alternatives, and potential for relieving her pelvic pain.  Her exam is unchanged from when I last saw her in the office.

## 2012-02-24 NOTE — Transfer of Care (Signed)
Immediate Anesthesia Transfer of Care Note  Patient: Melinda Baxter  Procedure(s) Performed: Procedure(s) (LRB): LAPAROSCOPIC INCISIONAL HERNIA (N/A) INSERTION OF MESH (N/A) LAPAROSCOPY DIAGNOSTIC (N/A) LAPAROSCOPIC LYSIS OF ADHESIONS (N/A)  Patient Location: PACU  Anesthesia Type: General  Level of Consciousness: awake and alert   Airway & Oxygen Therapy: Patient Spontanous Breathing and Patient connected to face mask oxygen  Post-op Assessment: Report given to PACU RN and Post -op Vital signs reviewed and stable  Post vital signs: Reviewed and stable  Complications: No apparent anesthesia complications

## 2012-02-25 LAB — BASIC METABOLIC PANEL
BUN: 9 mg/dL (ref 6–23)
CO2: 26 mEq/L (ref 19–32)
Chloride: 100 mEq/L (ref 96–112)
GFR calc non Af Amer: >90 mL/min (ref >90)
Glucose, Bld: 134 mg/dL — ABNORMAL HIGH (ref 70–99)
Potassium: 4.9 mEq/L (ref 3.5–5.1)

## 2012-02-25 LAB — CBC
HCT: 35.4 % — ABNORMAL LOW (ref 36.0–46.0)
Hemoglobin: 12.1 g/dL (ref 12.0–15.0)
MCH: 29.5 pg (ref 26.0–34.0)
MCHC: 34.2 g/dL (ref 30.0–36.0)
MCV: 86.3 fL (ref 78.0–100.0)

## 2012-02-25 MED ORDER — POLYETHYLENE GLYCOL 3350 17 G PO PACK
17.0000 g | PACK | Freq: Every day | ORAL | Status: DC
  Administered 2012-02-25 – 2012-02-26 (×2): 17 g via ORAL
  Filled 2012-02-25 (×3): qty 1

## 2012-02-25 NOTE — Progress Notes (Signed)
1 Day Post-Op  Subjective: Sore  Objective: Vital signs in last 24 hours: Temp:  [97.1 F (36.2 C)-98.7 F (37.1 C)] 98.2 F (36.8 C) (07/09 0545) Pulse Rate:  [58-79] 61  (07/09 0545) Resp:  [8-20] 18  (07/09 0545) BP: (104-145)/(59-90) 112/68 mmHg (07/09 0545) SpO2:  [94 %-100 %] 99 % (07/09 0545) Weight:  [162 lb 15.7 oz (73.927 kg)] 162 lb 15.7 oz (73.927 kg) (07/08 1132) Last BM Date: 02/23/12  Intake/Output from previous day: 07/08 0701 - 07/09 0700 In: 4078.3 [I.V.:4078.3] Out: 2950 [Urine:2950] Intake/Output this shift:    Incision/Wound:abdomen sore.  Dressings dry.  Lab Results:   [facility] 02/25/12 0353  WBC 12.0*  HGB 12.1  HCT 35.4*  PLT 191   BMET  Basename 02/25/12 0353  NA 134*  K 4.9  CL 100  CO2 26  GLUCOSE 134*  BUN 9  CREATININE 0.47*  CALCIUM 9.4   PT/INR No results found for this basename: LABPROT:2,INR:2 in the last 72 hours ABG No results found for this basename: PHART:2,PCO2:2,PO2:2,HCO3:2 in the last 72 hours  Studies/Results: No results found.  Anti-infectives: Anti-infectives     Start     Dose/Rate Route Frequency Ordered Stop   02/24/12 0600   ceFAZolin (ANCEF) IVPB 2 g/50 mL premix        2 g 100 mL/hr over 30 Minutes Intravenous 60 min pre-op 02/24/12 0537 02/24/12 0736          Assessment/Plan: s/p Procedure(s) (LRB): LAPAROSCOPIC INCISIONAL HERNIA (N/A) INSERTION OF MESH (N/A) LAPAROSCOPY DIAGNOSTIC (N/A) LAPAROSCOPIC LYSIS OF ADHESIONS (N/A) OOB Ambulate   LOS: 1 day    Sevan Mcbroom A. 02/25/2012

## 2012-02-25 NOTE — Progress Notes (Signed)
UR complete 

## 2012-02-25 NOTE — Progress Notes (Signed)
Patient ambulated in the hall. C/o nausea, relieved with zofran. Tolerating diet. Now voiding after several unsuccessful attempts.

## 2012-02-26 ENCOUNTER — Encounter (HOSPITAL_COMMUNITY): Payer: Self-pay | Admitting: Surgery

## 2012-02-26 MED ORDER — HYDROMORPHONE HCL PF 1 MG/ML IJ SOLN: 1.0000 mg | INTRAMUSCULAR | Status: DC | PRN

## 2012-02-26 MED ORDER — ACETAMINOPHEN 325 MG PO TABS
650.0000 mg | ORAL_TABLET | ORAL | Status: DC | PRN
  Administered 2012-02-26: 650 mg via ORAL
  Filled 2012-02-26: qty 2

## 2012-02-26 MED ORDER — KETOROLAC TROMETHAMINE 30 MG/ML IJ SOLN: 30.0000 mg | Freq: Four times a day (QID) | INTRAMUSCULAR | Status: DC | PRN

## 2012-02-26 NOTE — Progress Notes (Signed)
Removed dressing and D/C PCA patient tolerated well. Annitta Needs, RN

## 2012-02-26 NOTE — Progress Notes (Signed)
2 Days Post-Op  Subjective: Has HA   Objective: Vital signs in last 24 hours: Temp:  [97.6 F (36.4 C)-99 F (37.2 C)] 98.6 F (37 C) (07/10 0538) Pulse Rate:  [58-85] 76  (07/10 0538) Resp:  [10-18] 16  (07/10 0731) BP: (95-103)/(58-69) 101/62 mmHg (07/10 0538) SpO2:  [89 %-99 %] 99 % (07/10 0731) Last BM Date: 02/23/12  Intake/Output from previous day: 07/09 0701 - 07/10 0700 In: 1459.2 [P.O.:200; I.V.:1259.2] Out: 1825 [Urine:1825] Intake/Output this shift:    Incision/Wound:DRESSINGS DRY.  NON DISTENDED.  SOFT BUT SORE  Lab Results:   Basename 02/25/12 0353  WBC 12.0*  HGB 12.1  HCT 35.4*  PLT 191   BMET  Basename 02/25/12 0353  NA 134*  K 4.9  CL 100  CO2 26  GLUCOSE 134*  BUN 9  CREATININE 0.47*  CALCIUM 9.4   PT/INR No results found for this basename: LABPROT:2,INR:2 in the last 72 hours ABG No results found for this basename: PHART:2,PCO2:2,PO2:2,HCO3:2 in the last 72 hours  Studies/Results: No results found.  Anti-infectives: Anti-infectives     Start     Dose/Rate Route Frequency Ordered Stop   02/24/12 0600   ceFAZolin (ANCEF) IVPB 2 g/50 mL premix        2 g 100 mL/hr over 30 Minutes Intravenous 60 min pre-op 02/24/12 0537 02/24/12 0736          Assessment/Plan: s/p Procedure(s) (LRB): LAPAROSCOPIC INCISIONAL HERNIA (N/A) INSERTION OF MESH (N/A) LAPAROSCOPY DIAGNOSTIC (N/A) LAPAROSCOPIC LYSIS OF ADHESIONS (N/A) D/c PCA SL  IV OOB HOME Thursday if pain control adequate.  LOS: 2 days    Melinda Gotts A. 02/26/2012

## 2012-02-27 ENCOUNTER — Telehealth (INDEPENDENT_AMBULATORY_CARE_PROVIDER_SITE_OTHER): Payer: Self-pay | Admitting: General Surgery

## 2012-02-27 MED ORDER — POLYETHYLENE GLYCOL 3350 17 G PO PACK: 17.0000 g | PACK | Freq: Every day | ORAL | Status: AC

## 2012-02-27 MED ORDER — OXYCODONE-ACETAMINOPHEN 5-325 MG PO TABS: 1.0000 | ORAL_TABLET | ORAL | Status: AC | PRN

## 2012-02-27 NOTE — Discharge Summary (Signed)
Physician Discharge Summary  Patient ID: Melinda Baxter MRN: [id number] DOB/AGE: 1980-10-10 31 y.o.  Admit date: 02/24/2012 Discharge date: 02/27/2012  Admission Diagnoses: incisional hernia /pelvic pain  Discharge Diagnoses: same Active Problems:  * No active hospital problems. *    Discharged Condition: good  Hospital Course: She had good pain control.  Diet advanced slowly on POD 2 -3 .  On  Medications by mouth and tolerating diet,  Wounds clean dry intact.  Consults: None  Significant Diagnostic Studies: none  Treatments: surgery: Laparoscopic incisional hernia repair.  Diagnostic laparoscopy by Dr Newt Minion.  Discharge Exam: Blood pressure 126/79, pulse 85, temperature 98.8 F (37.1 C), temperature source Oral, resp. rate 20, height 5\' 1"  (1.549 m), weight 162 lb 15.7 oz (73.927 kg), SpO2 98.00%. Incision/Wound:incisions clean dry intact.  No redness or drainage.  Sore LLQ.  Disposition: Final discharge disposition not confirmed  Discharge Orders    Future Orders Please Complete By Expires   Diet - low sodium heart healthy      Increase activity slowly      Driving Restrictions      Comments:   No driving for 2 weeks.   Lifting restrictions      Comments:   No lifting for 6 weeks. Wear binder.  OK to shower.     Medication List  As of 02/27/2012  8:45 AM   TAKE these medications         acetaminophen 325 MG tablet   Commonly known as: TYLENOL   Take 650 mg by mouth every 6 (six) hours as needed. Migraines      ibuprofen 400 MG tablet   Commonly known as: ADVIL,MOTRIN   Take 400 mg by mouth every 6 (six) hours as needed. Migraines      oxyCODONE-acetaminophen 5-325 MG per tablet   Commonly known as: PERCOCET   Take 1-2 tablets by mouth every 4 (four) hours as needed.      polyethylene glycol packet   Commonly known as: MIRALAX / GLYCOLAX   Take 17 g by mouth daily.           Follow-up Information    Follow up with Melinda Dockendorf A., MD in 1  week.   Contact information:   [employer] [address], [address] [place] [address] [phone]          Signed: Dortha Schwalbe. 02/27/2012, 8:45 AM

## 2012-02-27 NOTE — Telephone Encounter (Signed)
Nurse at hospital calling for office follow-up in 1 week, per Dr. Luisa Hart.  Pt will be called at home with this appt once he can be worked-in to a schedule.

## 2012-02-27 NOTE — Care Management Note (Signed)
    Page 1 of 1   02/27/2012     2:32:11 PM j  CARE MANAGEMENT NOTE 02/27/2012  Patient:  Melinda Baxter,Melinda Baxter   Account Number:  [id number]  Date Initiated:  02/25/2012  Documentation initiated by:  Lorenda Ishihara  Subjective/Objective Assessment:   31 yo female admitted s/p lap hernia repair. PTA lived at home.     Action/Plan:   Anticipated DC Date:  02/27/2012   Anticipated DC Plan:  HOME/SELF CARE      DC Planning Services  CM consult      Choice offered to / List presented to:             Status of service:  Completed, signed off Medicare Important Message given?   (If response is "NO", the following Medicare IM given date fields will be blank) Date Medicare IM given:   Date Additional Medicare IM given:    Discharge Disposition:  HOME/SELF CARE  Per UR Regulation:  Reviewed for med. necessity/level of care/duration of stay  If discussed at Long Length of Stay Meetings, dates discussed:    Comments:

## 2012-02-27 NOTE — Telephone Encounter (Signed)
Spoke with nurse and gave her appt info.

## 2012-02-27 NOTE — Progress Notes (Signed)
Patients discharge instructions and prescriptions given to her by Bayard Males RN [facility], with help of [facility] interpreter.

## 2012-03-06 ENCOUNTER — Ambulatory Visit (INDEPENDENT_AMBULATORY_CARE_PROVIDER_SITE_OTHER): Payer: Medicaid Other

## 2012-03-06 DIAGNOSIS — G8918 Other acute postprocedural pain: Secondary | ICD-10-CM

## 2012-03-06 DIAGNOSIS — Z4802 Encounter for removal of sutures: Secondary | ICD-10-CM

## 2012-03-06 MED ORDER — HYDROCODONE-ACETAMINOPHEN 5-325 MG PO TABS: 1.0000 | ORAL_TABLET | Freq: Four times a day (QID) | ORAL | Status: AC | PRN

## 2012-03-06 NOTE — Progress Notes (Signed)
Patient came in for staple removal. I removed all staples and applied steri strips. I called in hydrocodone into [facility] on [address] and [place] per protocol.

## 2012-03-26 ENCOUNTER — Encounter (INDEPENDENT_AMBULATORY_CARE_PROVIDER_SITE_OTHER): Payer: Self-pay | Admitting: Surgery

## 2012-03-26 ENCOUNTER — Ambulatory Visit (INDEPENDENT_AMBULATORY_CARE_PROVIDER_SITE_OTHER): Payer: Medicaid Other | Admitting: Surgery

## 2012-03-26 VITALS — BP 116/68 | HR 70 | Temp 97.8°F | Resp 17 | Ht 60.0 in | Wt 160.2 lb

## 2012-03-26 DIAGNOSIS — Z9889 Other specified postprocedural states: Secondary | ICD-10-CM

## 2012-03-26 MED ORDER — OXYCODONE-ACETAMINOPHEN 5-325 MG PO TABS: 1.0000 | ORAL_TABLET | ORAL | Status: AC | PRN

## 2012-03-26 NOTE — Progress Notes (Signed)
Patient returns after laparoscopic incisional hernia repair. She is still sore but doing okay.  Exam: Incisions and well. No evidence of hernia recurrence. She is tender in the right inguinal region lifting region corresponding to suture. Sometimes his pain will be spontaneous in radiate down the anterior thigh.  Impression: Status post laparoscopic repair incisional hernia  Plan: Return to work in 2 weeks.  RTC 1 month.Restrictions give for  Return to work and lifting for the next 1 month.

## 2012-03-26 NOTE — Patient Instructions (Signed)
Return to work 04/09/12.  Pt not ready for prolonged standing until then.  See note for restrictions.

## 2012-04-28 ENCOUNTER — Encounter (INDEPENDENT_AMBULATORY_CARE_PROVIDER_SITE_OTHER): Payer: Medicaid Other | Admitting: Surgery

## 2012-05-14 ENCOUNTER — Encounter (INDEPENDENT_AMBULATORY_CARE_PROVIDER_SITE_OTHER): Payer: Self-pay | Admitting: Surgery

## 2012-05-14 ENCOUNTER — Other Ambulatory Visit (INDEPENDENT_AMBULATORY_CARE_PROVIDER_SITE_OTHER): Payer: Self-pay

## 2012-05-14 ENCOUNTER — Ambulatory Visit (INDEPENDENT_AMBULATORY_CARE_PROVIDER_SITE_OTHER): Payer: Medicaid Other | Admitting: Surgery

## 2012-05-14 VITALS — BP 112/82 | HR 74 | Temp 98.4°F | Resp 14 | Ht 61.0 in | Wt 163.8 lb

## 2012-05-14 DIAGNOSIS — Z9889 Other specified postprocedural states: Secondary | ICD-10-CM

## 2012-05-14 DIAGNOSIS — R109 Unspecified abdominal pain: Secondary | ICD-10-CM

## 2012-05-14 NOTE — Progress Notes (Signed)
Patient returns 2 months after laparoscopic incisional hernia repair. Her pain is she had initially his, she has occasional pain in her left flank and left back blood or urine. She does have some pain when she twists.  Exam: Abdomen soft without signs of hernia recurrence. Incisions well-healed. No evidence of bulge was set up.  Impression: Left flank plain blood or urine  Plan: We'll set up for CT scan for stone survey and send a UA CNS to exclude kidney stent. May need urology referral. Return 3 months.

## 2012-05-14 NOTE — Patient Instructions (Signed)
Return 3 months

## 2012-05-15 LAB — URINALYSIS, ROUTINE W REFLEX MICROSCOPIC
Hgb urine dipstick: NEGATIVE
Leukocytes, UA: NEGATIVE
Nitrite: NEGATIVE
Protein, ur: NEGATIVE mg/dL
Urobilinogen, UA: 0.2 mg/dL (ref 0.0–1.0)
pH: 6 (ref 5.0–8.0)

## 2012-05-18 ENCOUNTER — Ambulatory Visit
Admission: RE | Admit: 2012-05-18 | Discharge: 2012-05-18 | Disposition: A | Discharge status: Home / Self Care | Payer: Medicaid Other | Source: Ambulatory Visit | Attending: Surgery | Admitting: Surgery

## 2012-05-18 DIAGNOSIS — R109 Unspecified abdominal pain: Secondary | ICD-10-CM

## 2012-05-20 ENCOUNTER — Telehealth (INDEPENDENT_AMBULATORY_CARE_PROVIDER_SITE_OTHER): Payer: Self-pay

## 2012-05-20 NOTE — Telephone Encounter (Signed)
Called patient to give her CT results. CT is normal. No sign of kidney stone.

## 2012-08-21 ENCOUNTER — Encounter (INDEPENDENT_AMBULATORY_CARE_PROVIDER_SITE_OTHER): Payer: Self-pay | Admitting: Surgery

## 2012-08-21 ENCOUNTER — Ambulatory Visit (INDEPENDENT_AMBULATORY_CARE_PROVIDER_SITE_OTHER): Payer: BC Managed Care – PPO | Admitting: Surgery

## 2012-08-21 VITALS — BP 120/92 | HR 83 | Temp 97.5°F | Ht 61.0 in | Wt 169.2 lb

## 2012-08-21 DIAGNOSIS — Z9889 Other specified postprocedural states: Secondary | ICD-10-CM

## 2012-08-21 DIAGNOSIS — Z8719 Personal history of other diseases of the digestive system: Secondary | ICD-10-CM

## 2012-08-21 NOTE — Patient Instructions (Signed)
Return as needed

## 2012-08-21 NOTE — Progress Notes (Signed)
Subjective:     Patient ID: Melinda Baxter, female   DOB: [birth year], 32 y.o.   MRN: [id number]  HPI patient returns in followup due to history of incisional hernia repaired in July 2014. Her pain is much better except for a little bit left lower quadrant pain at the stitch site. CT was done to exclude kidney stains and there is no evidence of kidney stones nor recurrent hernia.   Review of Systems  Constitutional: Negative.   Cardiovascular: Negative.   Gastrointestinal: Negative.        Objective:   Physical Exam  Neck: Normal range of motion.  Abdominal:    Skin: Skin is warm and dry.  Psychiatric: She has a normal mood and affect. Her behavior is normal. Judgment and thought content normal.       Assessment:     History a laparoscopic incisional hernia repair doing well    Plan:     Return to clinic as needed. CT scan revealed no recurrent hernia nor kidney stones.

## 2013-07-15 IMAGING — US US PELVIS COMPLETE
1 series · 14 of 25 positions shown · non-contrast
Comparison: None.

CLINICAL DATA: Abdominal/pelvic pain, status post bilateral
salpingo-oophorectomy, history of ectopic pregnancy

TRANSABDOMINAL AND TRANSVAGINAL ULTRASOUND OF PELVIS
TECHNIQUE: Both transabdominal and transvaginal ultrasound
examinations of the pelvis were performed. Transabdominal technique
was performed for global imaging of the pelvis including uterus,
ovaries, adnexal regions, and pelvic cul-de-sac.

[Series 1: us pelvis complete · 0.19mm/px · 14 of 66 slices shown]
[im 1/66]
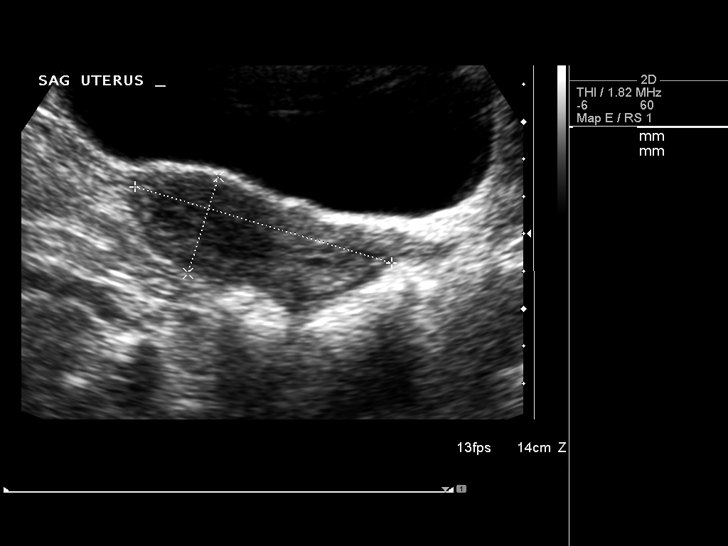
[im 6/66]
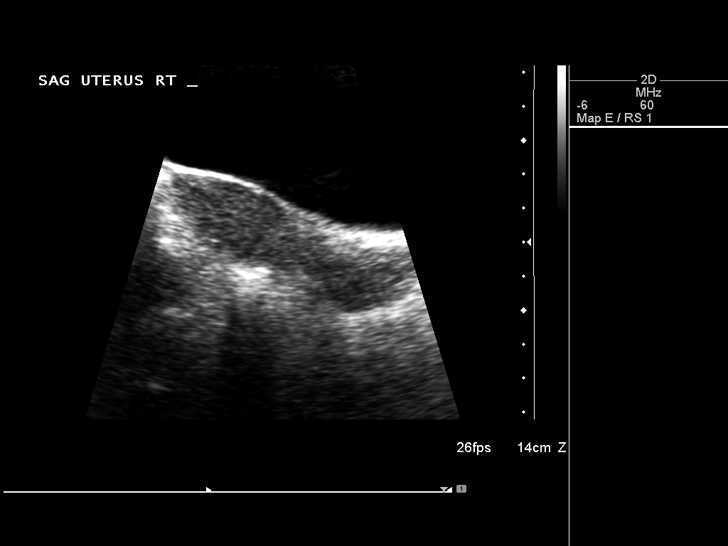
[im 11/66]
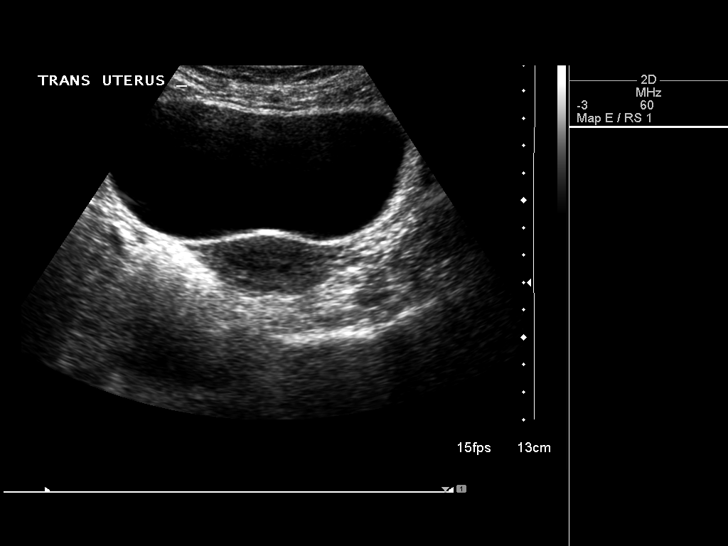
[im 17/66]
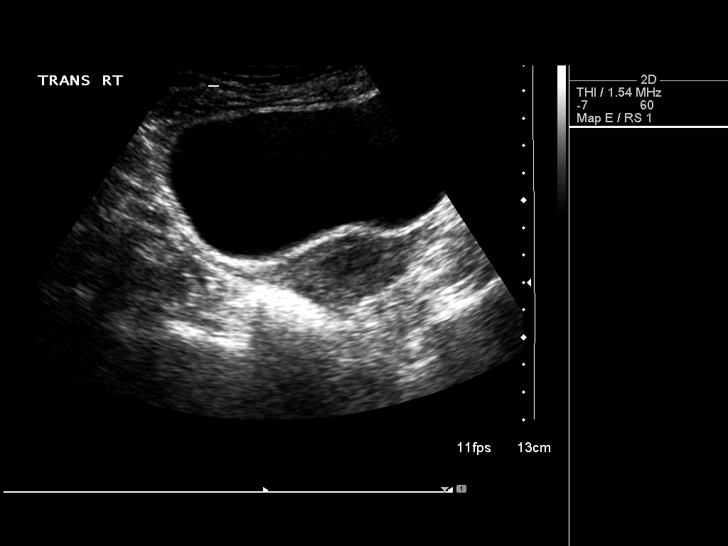
[im 22/66]
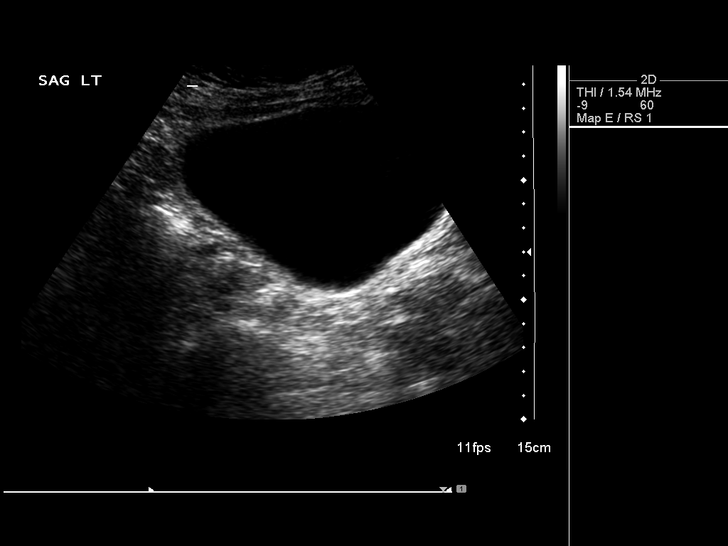
[im 25/66]
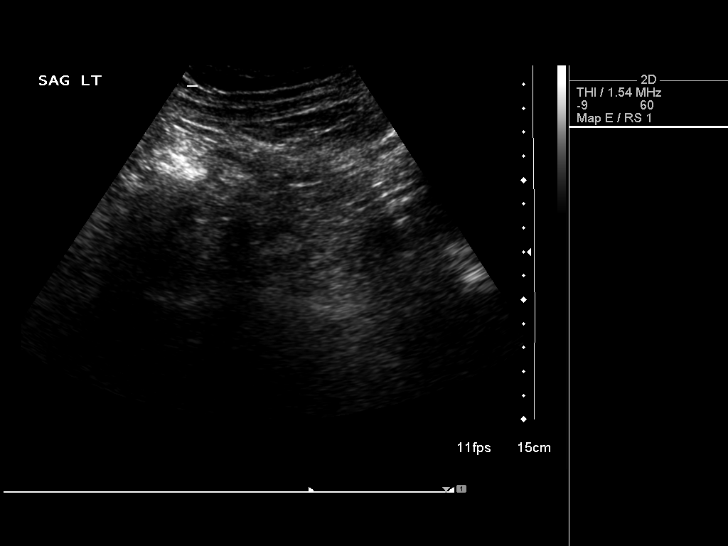
[im 30/66]
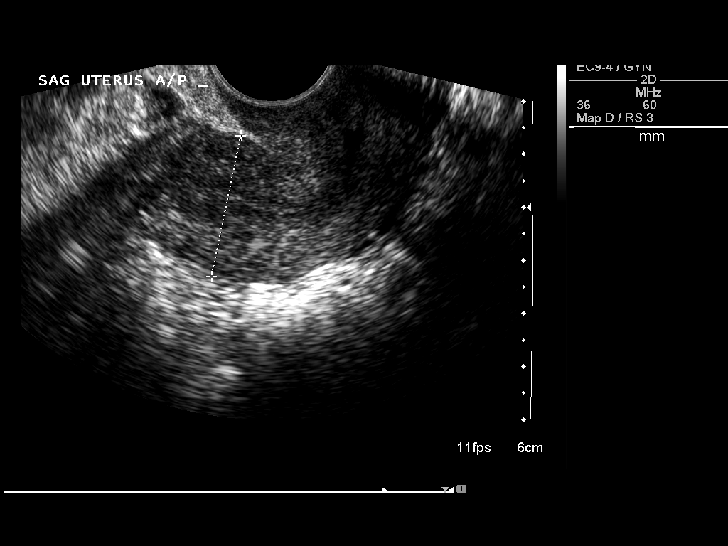
[im 36/66]
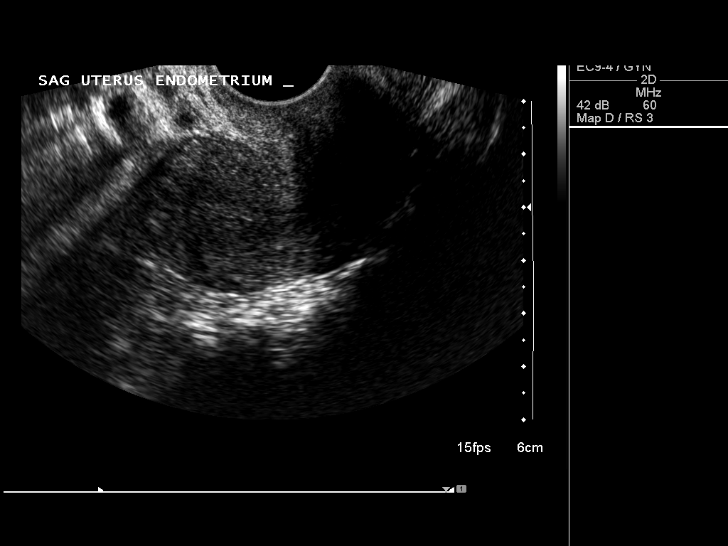
[im 41/66]
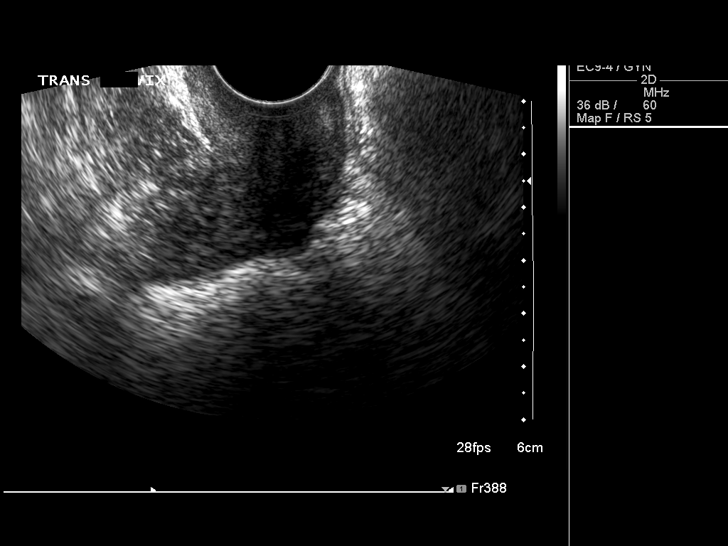
[im 44/66]
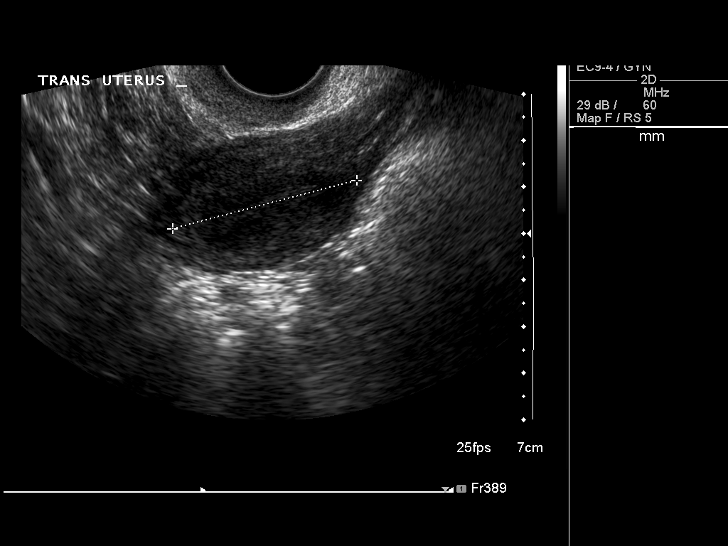
[im 49/66]
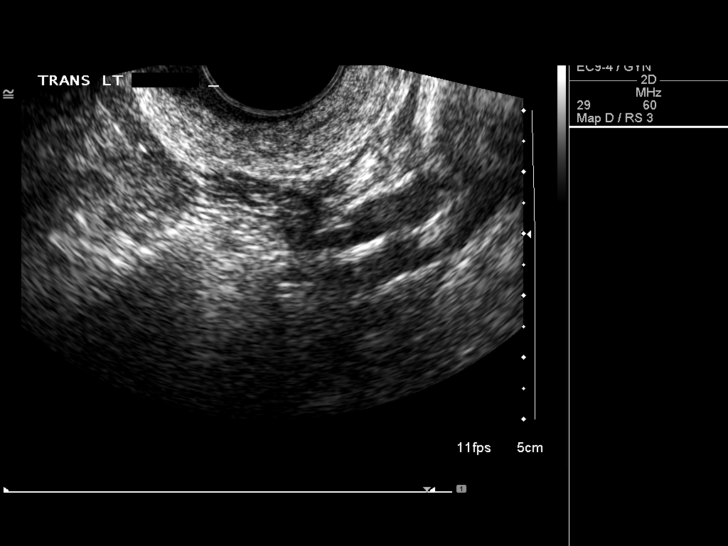
[im 55/66]
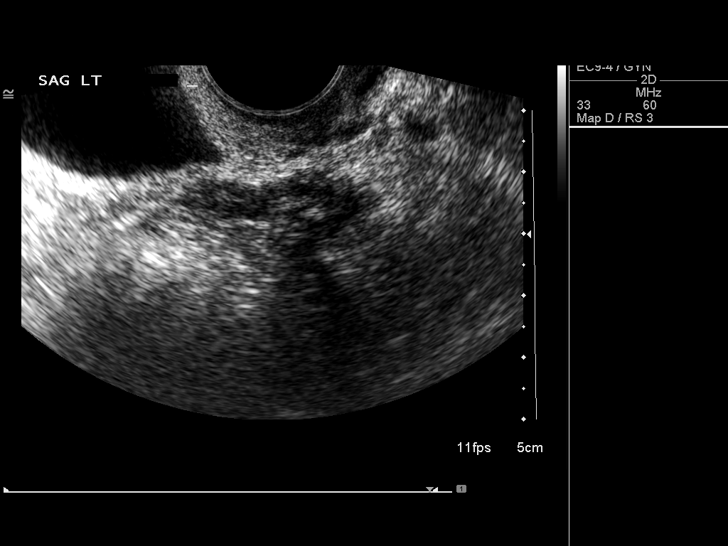
[im 60/66]
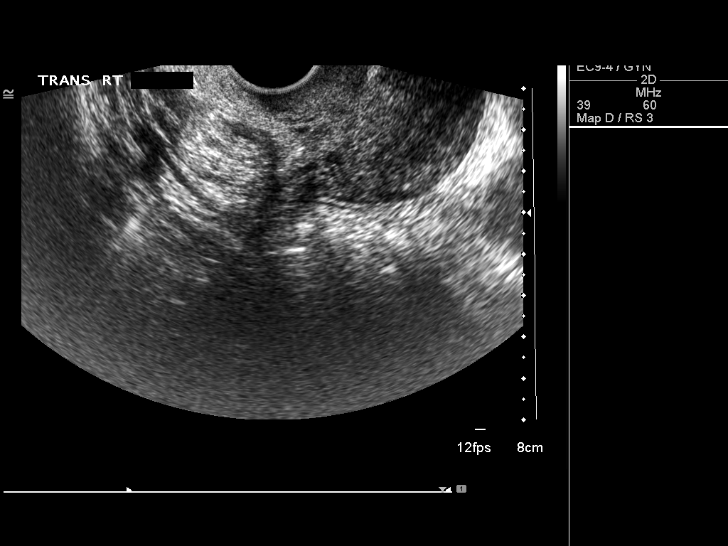
[im 66/66]
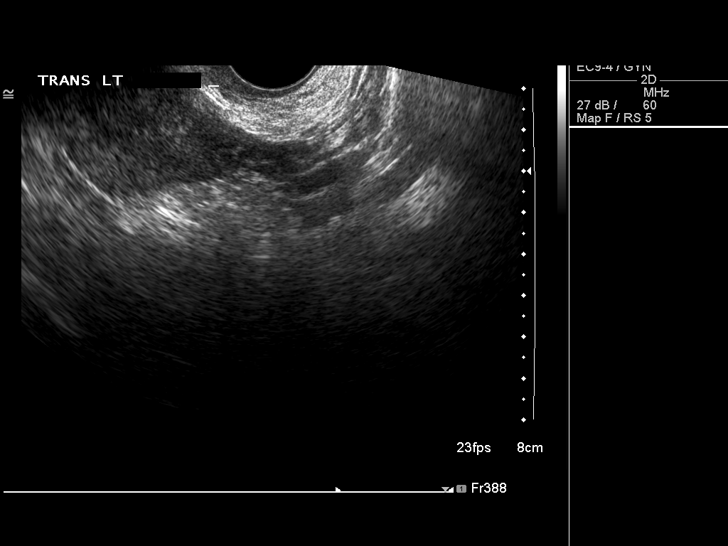

[14 of 25 positions shown; findings below may reference images not displayed]

It was necessary to proceed with endovaginal exam following the
transabdominal exam to visualize the endometrium.
FINDINGS: Uterus: Normal in size and appearance, measuring 6.1 x 2.7 x
cm.

Endometrium: Normal in thickness and appearance, measuring 4 mm in
thickness.

Right ovary:  Not visualized.

Left ovary: Soft tissue in the left adnexa, measuring 11 x 7 x 8
mm, possibly reflecting remnant ovarian tissue.

Other findings: Trace free fluid.
IMPRESSION: Possible ovarian remnant in the left adnexa, as described above.

Right ovary is not discretely visualized.

## 2013-10-15 IMAGING — CR DG CHEST 2V
2 series · 2 of 2 positions shown · non-contrast
Comparison: None.

CLINICAL DATA: Preop.

CHEST - 2 VIEW

[w chest pa]
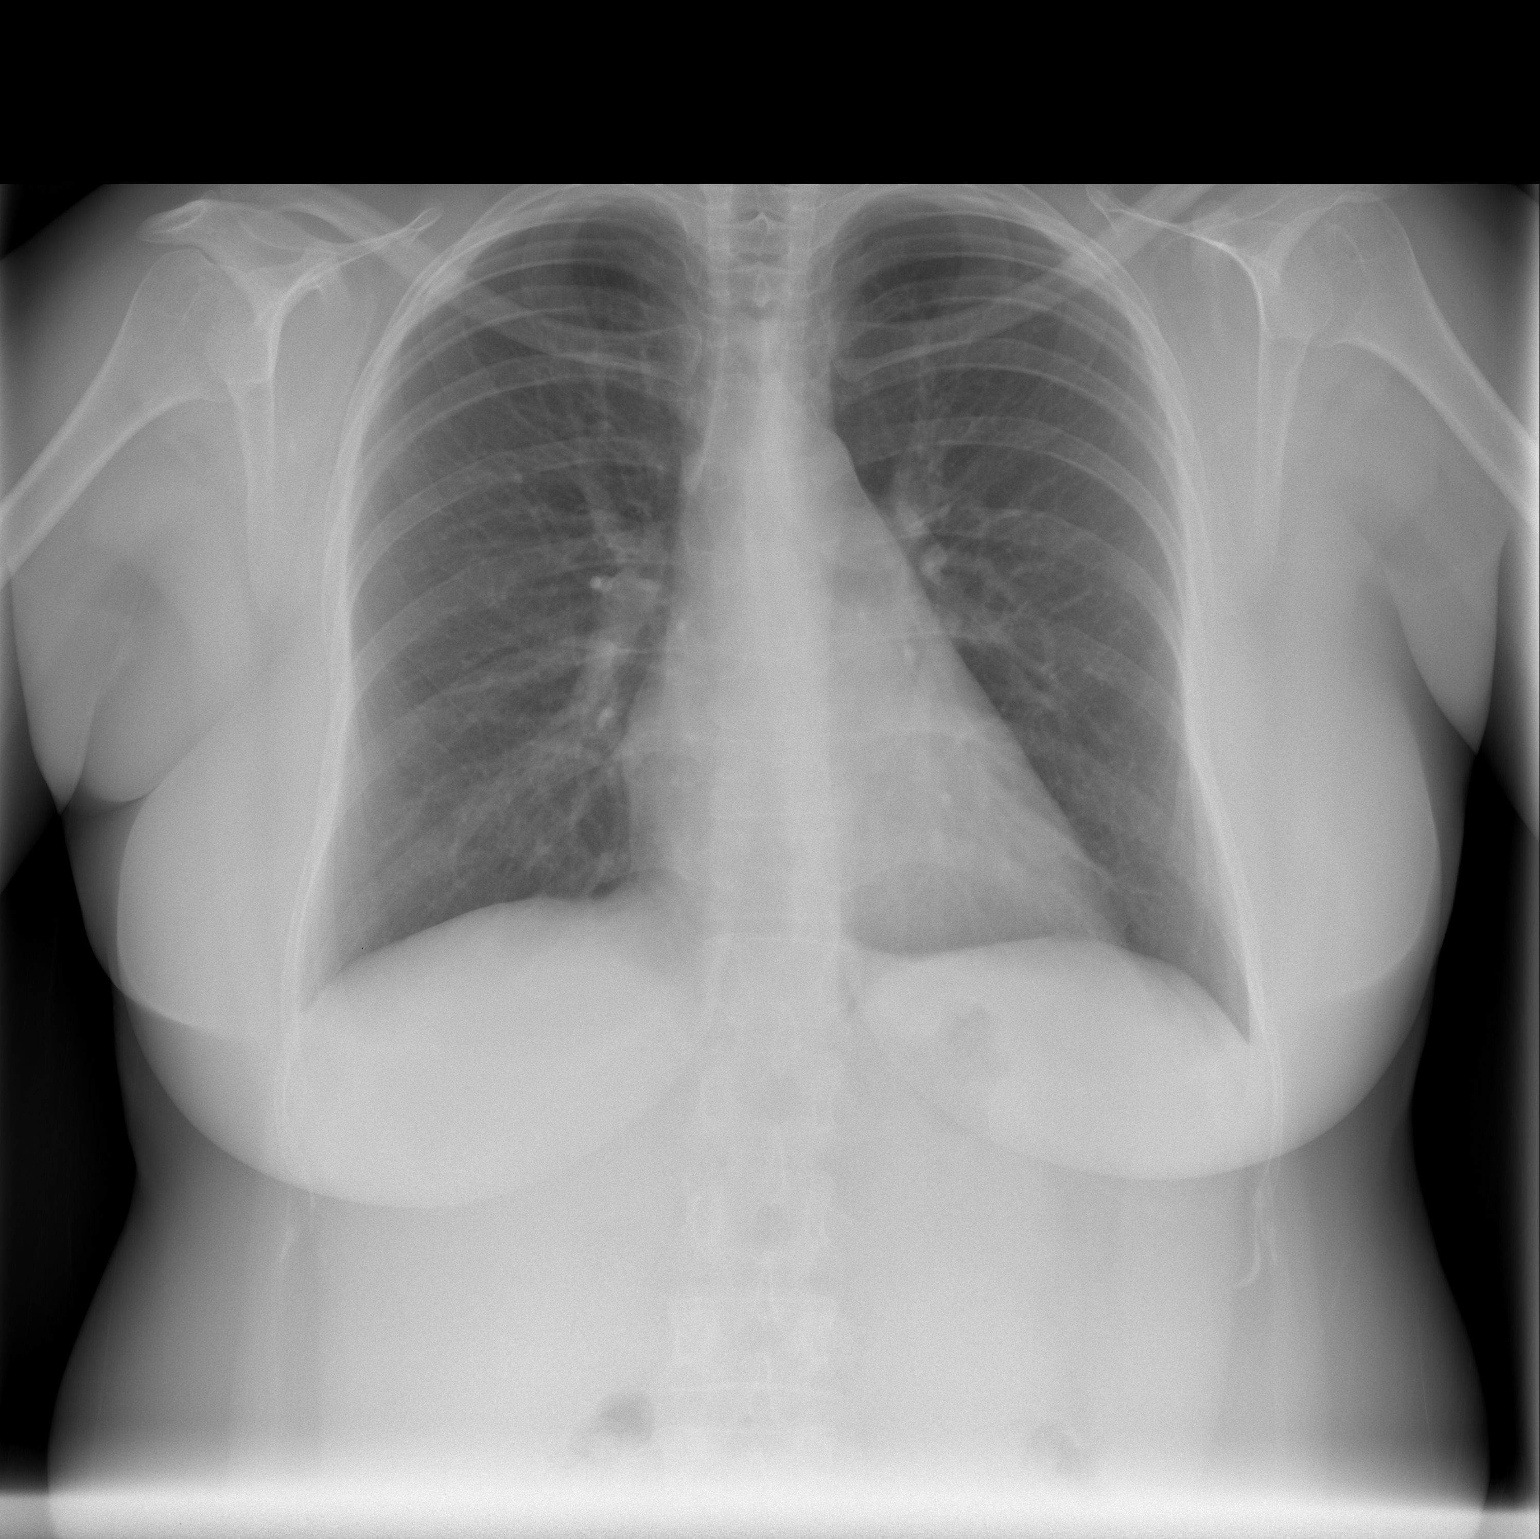

[w chest lat]
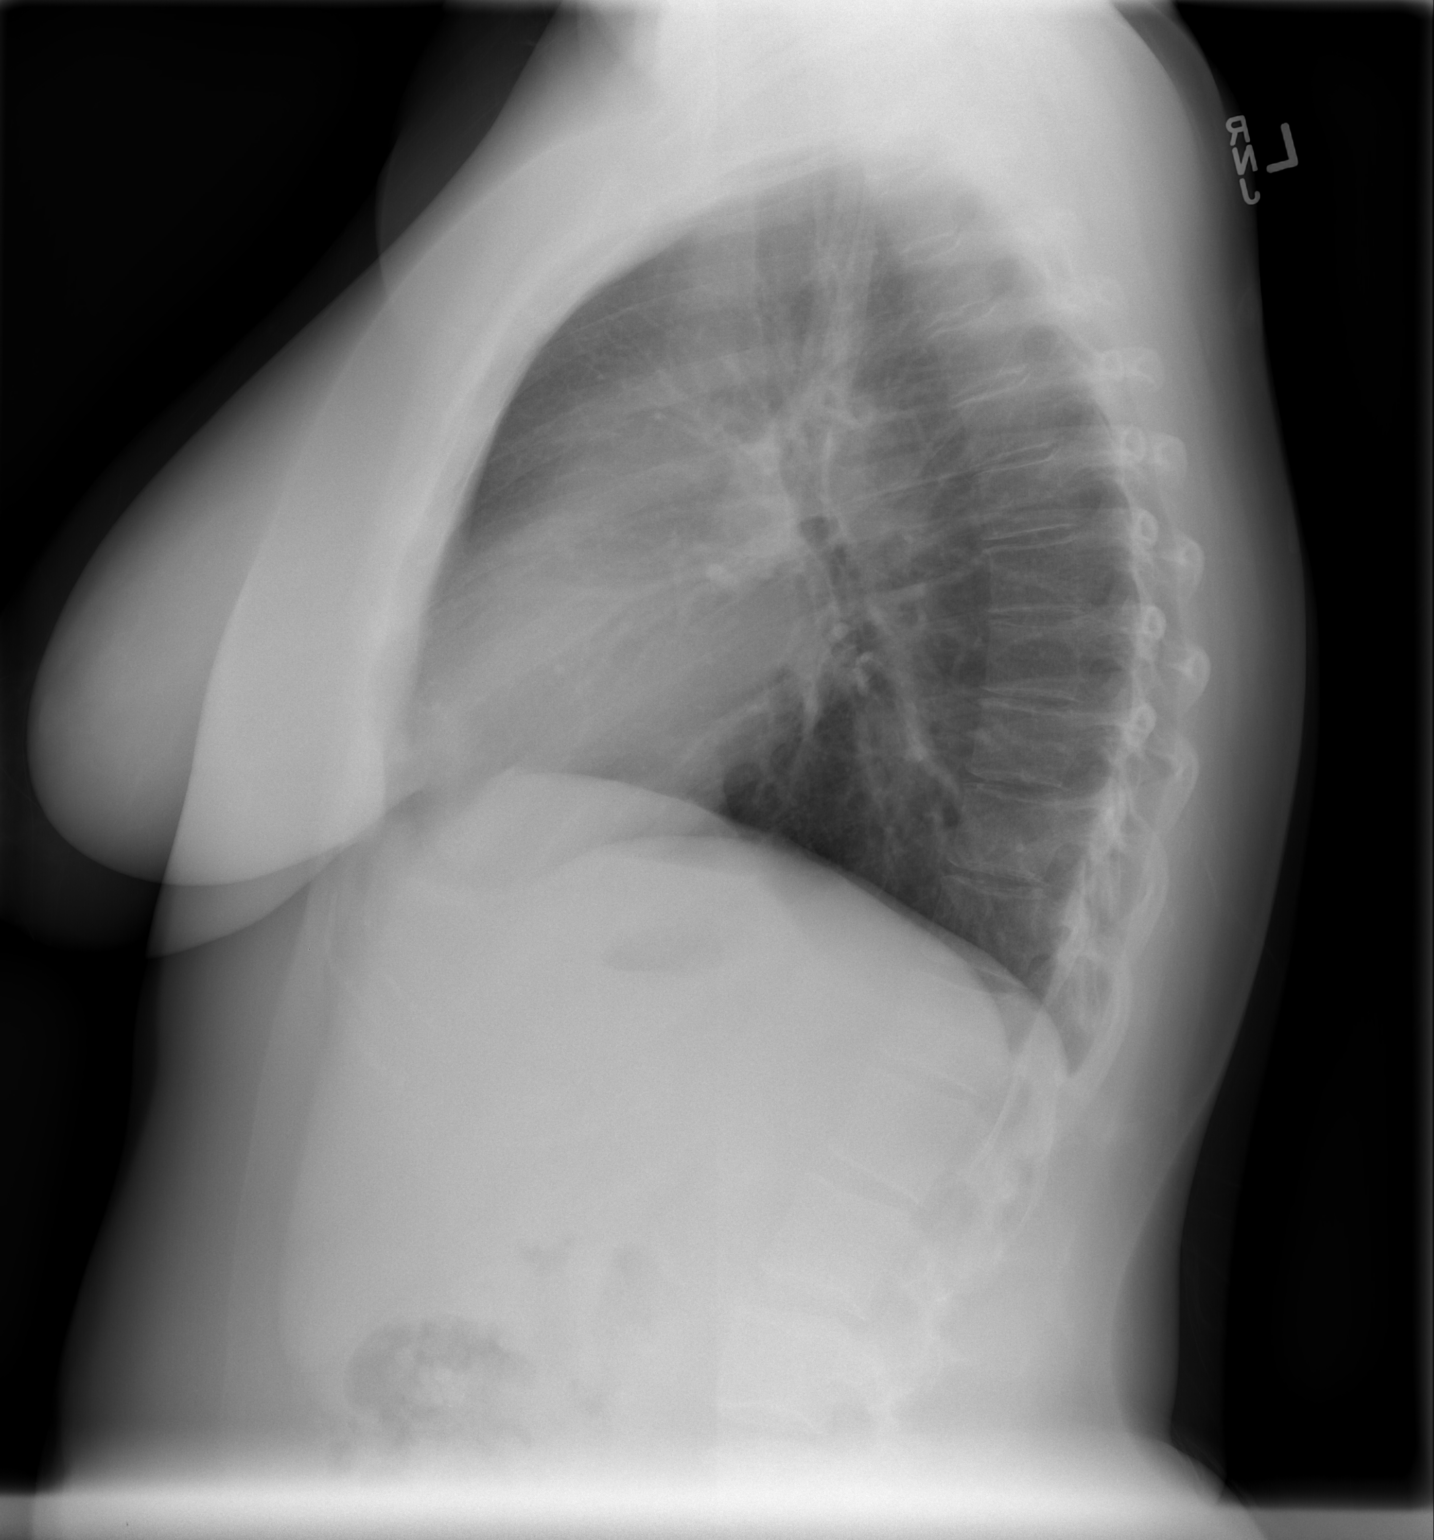

[2 of 2 positions shown; findings below may reference images not displayed]

FINDINGS: Heart and mediastinal contours are within normal limits.
No focal opacities or effusions.  No acute bony abnormality.
IMPRESSION: Normal study.

## 2014-11-25 ENCOUNTER — Emergency Department (HOSPITAL_COMMUNITY): Payer: Worker's Compensation

## 2014-11-25 ENCOUNTER — Emergency Department (HOSPITAL_COMMUNITY)
Admission: EM | Admit: 2014-11-25 | Discharge: 2014-11-25 | Disposition: A | Discharge status: Home / Self Care | Payer: Worker's Compensation | Attending: Emergency Medicine | Admitting: Emergency Medicine

## 2014-11-25 ENCOUNTER — Encounter (HOSPITAL_COMMUNITY): Payer: Self-pay | Admitting: *Deleted

## 2014-11-25 DIAGNOSIS — Z8639 Personal history of other endocrine, nutritional and metabolic disease: Secondary | ICD-10-CM | POA: Insufficient documentation

## 2014-11-25 DIAGNOSIS — S59912A Unspecified injury of left forearm, initial encounter: Secondary | ICD-10-CM | POA: Insufficient documentation

## 2014-11-25 DIAGNOSIS — Z8742 Personal history of other diseases of the female genital tract: Secondary | ICD-10-CM | POA: Insufficient documentation

## 2014-11-25 DIAGNOSIS — Y990 Civilian activity done for income or pay: Secondary | ICD-10-CM | POA: Diagnosis not present

## 2014-11-25 DIAGNOSIS — Z8719 Personal history of other diseases of the digestive system: Secondary | ICD-10-CM | POA: Insufficient documentation

## 2014-11-25 DIAGNOSIS — Y9289 Other specified places as the place of occurrence of the external cause: Secondary | ICD-10-CM | POA: Diagnosis not present

## 2014-11-25 DIAGNOSIS — W19XXXA Unspecified fall, initial encounter: Secondary | ICD-10-CM

## 2014-11-25 DIAGNOSIS — S199XXA Unspecified injury of neck, initial encounter: Secondary | ICD-10-CM | POA: Insufficient documentation

## 2014-11-25 DIAGNOSIS — S8991XA Unspecified injury of right lower leg, initial encounter: Secondary | ICD-10-CM | POA: Diagnosis present

## 2014-11-25 DIAGNOSIS — S5002XA Contusion of left elbow, initial encounter: Secondary | ICD-10-CM | POA: Diagnosis not present

## 2014-11-25 DIAGNOSIS — W010XXA Fall on same level from slipping, tripping and stumbling without subsequent striking against object, initial encounter: Secondary | ICD-10-CM | POA: Insufficient documentation

## 2014-11-25 DIAGNOSIS — S59911A Unspecified injury of right forearm, initial encounter: Secondary | ICD-10-CM | POA: Insufficient documentation

## 2014-11-25 DIAGNOSIS — S8002XA Contusion of left knee, initial encounter: Secondary | ICD-10-CM | POA: Insufficient documentation

## 2014-11-25 DIAGNOSIS — S79911A Unspecified injury of right hip, initial encounter: Secondary | ICD-10-CM | POA: Diagnosis not present

## 2014-11-25 DIAGNOSIS — S8001XA Contusion of right knee, initial encounter: Secondary | ICD-10-CM

## 2014-11-25 DIAGNOSIS — S5001XA Contusion of right elbow, initial encounter: Secondary | ICD-10-CM | POA: Diagnosis not present

## 2014-11-25 DIAGNOSIS — Y9389 Activity, other specified: Secondary | ICD-10-CM | POA: Diagnosis not present

## 2014-11-25 DIAGNOSIS — Z8679 Personal history of other diseases of the circulatory system: Secondary | ICD-10-CM | POA: Diagnosis not present

## 2014-11-25 MED ORDER — HYDROCODONE-ACETAMINOPHEN 5-325 MG PO TABS
1.0000 | ORAL_TABLET | Freq: Once | ORAL | Status: AC
  Administered 2014-11-25: 1 via ORAL
  Filled 2014-11-25: qty 1

## 2014-11-25 MED ORDER — NAPROXEN 500 MG PO TABS: 500.0000 mg | ORAL_TABLET | Freq: Two times a day (BID) | ORAL | Status: DC

## 2014-11-25 MED ORDER — TRAMADOL HCL 50 MG PO TABS: 50.0000 mg | ORAL_TABLET | Freq: Four times a day (QID) | ORAL | Status: DC | PRN

## 2014-11-25 NOTE — ED Provider Notes (Signed)
CSN: [id number]     Arrival date & time 11/25/14  1257 History   First MD Initiated Contact with Patient 11/25/14 1312    This chart was scribed for non-physician practitioner, Jaynie Crumble, PA, working with Samuel Jester, DO by Marica Otter, ED Scribe. This patient was seen in room TR05C/TR05C and the patient's care was started at 1:15 PM.  Chief Complaint  Patient presents with  . Fall   The history is provided by the patient. No language interpreter was used.   PCP: PROVIDER NOT IN SYSTEM HPI Comments: Melinda Baxter is a 34 y.o. female, with PMH noted below, who presents to the Emergency Department complaining of a fall sustained yesterday while at work. Pt reports she slipped on some cucumbers and fell on her front. Pt also complains of associated pain to her upper legs, knees bilaterally (pain being worse in right knee than left knee), right hip, forearm bilaterally, and neck. Pt reports taking ibuprofen yesterday with mild relief. Pt notes the pain is worse today than yesterday. Pt denies trouble walking.   Past Medical History  Diagnosis Date  . Hyperlipidemia   . Complication of anesthesia     wakes up early per pt   . GERD (gastroesophageal reflux disease)   . Headache(784.0)     migraines   . Ovarian cyst     ruptured  developed infection 2009    Past Surgical History  Procedure Laterality Date  . Salpingo-ooprectomy of left side  09/2007  . Ectopic pregnancy surgery  10/2010  . Salpingo-oophrectomy of right side  10/2010  . Incisional hernia repair  02/24/2012    Procedure: LAPAROSCOPIC INCISIONAL HERNIA;  Surgeon: Clovis Pu. Cornett, MD;  Location: [facility];  Service: General;  Laterality: N/A;  Laparoscopic Repair Incisional Hernia with Mesh  . Laparoscopy  02/24/2012    Procedure: LAPAROSCOPY DIAGNOSTIC;  Surgeon: Lavina Hamman, MD;  Location: [facility];  Service: Gynecology;  Laterality: N/A;  . Hernia repair  02/24/12    Incisional hernia repair with mesh   History  reviewed. No pertinent family history. History  Substance Use Topics  . Smoking status: Never Smoker   . Smokeless tobacco: Never Used  . Alcohol Use: No   OB History    No data available     Review of Systems  Constitutional: Negative for fever and chills.  Musculoskeletal: Positive for neck pain. Negative for gait problem.       Pain to upper legs, knees bilaterally (pain being worse in right knee than left knee), right hip, forearm bilaterally  Neurological: Negative for speech difficulty.  Psychiatric/Behavioral: Negative for confusion.      Allergies  Review of patient's allergies indicates no known allergies.  Home Medications   Prior to Admission medications   Medication Sig Start Date End Date Taking? Authorizing Provider  acetaminophen (TYLENOL) 325 MG tablet Take 650 mg by mouth every 6 (six) hours as needed. Migraines    Historical Provider, MD  ibuprofen (ADVIL,MOTRIN) 400 MG tablet Take 400 mg by mouth every 6 (six) hours as needed. Migraines    Historical Provider, MD   Triage Vitals: BP 123/79 mmHg  Pulse 61  Temp(Src) 98.4 F (36.9 C) (Oral)  Resp 18  Ht  (1.575 m)  Wt 165 lb (74.844 kg)  BMI 30.17 kg/m2  SpO2 98% Physical Exam  Constitutional: She is oriented to person, place, and time. She appears well-developed and well-nourished. No distress.  HENT:  Head: Normocephalic and atraumatic.  Eyes:  Conjunctivae and EOM are normal.  Neck: Neck supple.  Cardiovascular: Normal rate.   Pulmonary/Chest: Effort normal. No respiratory distress.  Musculoskeletal: Normal range of motion. She exhibits tenderness.  Tenderness to palpation right trapezius. No midline cervical, thoracic, lumbar spine tenderness. Tender to palpation over the olecranon of bilateral elbows, no bruising or abrasions noted. Full range of motion of the elbows bilaterally. Normal bilateral wrist. Radial pulses intact and equal bilaterally. Normal-appearing bilateral knees. Tender to  palpation over anterior knees, right knee tender palpation over patella. Pain with flexion of bilateral knees, full range of motion passively. Negative anterior and posterior drawer sign bilaterally. No laxity with medial or lateral stress bilaterally. Tender palpation of her right hip, pain with range of motion. No bruising or swelling noted. Dorsal pedal pulses equal bilaterally  Neurological: She is alert and oriented to person, place, and time.  Skin: Skin is warm and dry.  Psychiatric: She has a normal mood and affect. Her behavior is normal.  Nursing note and vitals reviewed.   ED Course  Procedures (including critical care time) DIAGNOSTIC STUDIES: Oxygen Saturation is 98% on ra, nl by my interpretation.    COORDINATION OF CARE: 1:21 PM-Discussed treatment plan which includes imaging with pt at bedside and pt agreed to plan.   Labs Review Labs Reviewed - No data to display  Imaging Review No results found.   EKG Interpretation None      MDM   Final diagnoses:  Fall  Contusion of knee, right, initial encounter  Contusion of left knee, initial encounter  Elbow contusion, right, initial encounter  Elbow contusion, left, initial encounter    Patient in the emergency department after a fall yesterday. She is complaining of bilateral elbow, right-sided neck, bilateral knee pain. Right knee and right hip x-rayed, due to bony tenderness. She is ambulatory with no difficulties. X-rays are negative. Most likely sore, contusions from the fall. Will treat with NSAIDs and Ultram. Follow up with primary care doctor. No head injury. No other indications of further imaging in emergency department. Neurovascularly intact.  Filed Vitals:   11/25/14 1310  BP: 123/79  Pulse: 61  Temp: 98.4 F (36.9 C)  TempSrc: Oral  Resp: 18  Height: 5\' 2"  (1.575 m)  Weight: 165 lb (74.844 kg)  SpO2: 98%   I personally performed the services described in this documentation, which was scribed in  my presence. The recorded information has been reviewed and is accurate.    Jaynie Crumbleatyana Neshawn Aird, PA-C 11/25/14 1431  Gwyneth SproutWhitney Plunkett, MD 11/29/14 [phone]

## 2014-11-25 NOTE — ED Notes (Signed)
Pt reports slipping and falling yesterday at work, reports pain to entire body but more severe right shoulder/neck. Ambulatory at triage.

## 2014-11-25 NOTE — Discharge Instructions (Signed)
Take naproxen and Ultram for pain. Rest. Ice packs several times a day. Follow-up with your doctor for recheck of not improving   Contusin (Contusion) Una contusin es un hematoma profundo. Las contusiones son el resultado de una lesin que causa sangrado debajo de la piel. La zona de la contusin puede ponerse [place], [place] o [place]. Las lesiones menores causarn contusiones sin Engineer, miningdolor, Biomedical engineerpero las ms graves pueden presentar dolor e inflamacin durante un par de semanas.  CAUSAS  Generalmente, una contusin se debe a un golpe, un traumatismo o una fuerza directa en una zona del cuerpo. SNTOMAS   Hinchazn y enrojecimiento en la zona de la lesin.  Hematomas en la zona de la lesin.  Dolor con la palpacin y sensibilidad en la zona de la lesin.  Dolor. DIAGNSTICO  Se puede establecer el diagnstico al hacer una historia clnica y un examen fsico. Nicanor Bakeal vez sea necesario hacer una radiografa, una tomografa computarizada o una resonancia magntica para determinar si hay lesiones asociadas, como fracturas. TRATAMIENTO  El tratamiento especfico depender de la zona del cuerpo donde se produjo la lesin. En general, el mejor tratamiento para una contusin es el reposo, la aplicacin de hielo, la elevacin de la zona y la aplicacin de compresas fras en la zona de la lesin. Para calmar el dolor tambin podrn recomendarle medicamentos de venta libre. Pregntele al mdico cul es el mejor tratamiento para su contusin. INSTRUCCIONES PARA EL CUIDADO EN EL HOGAR   Aplique hielo sobre la zona lesionada.  Ponga el hielo en una bolsa plstica.  Colquese una toalla entre la piel y la bolsa de hielo.  Deje el hielo durante 15 a 20minutos, 3 a 4veces por da, o segn las indicaciones del mdico.  Utilice los medicamentos de venta libre o recetados para Primary school teachercalmar el dolor, el malestar o la Saxtonfiebre, segn se lo indique el mdico. El mdico podr indicarle que evite tomar antiinflamatorios  (aspirina, ibuprofeno y naproxeno) durante 48 horas ya que estos medicamentos pueden aumentar los hematomas.  Mantenga la zona de la lesin en reposo.  Si es posible, eleve la zona de la lesin para reducir la hinchazn. SOLICITE ATENCIN MDICA DE INMEDIATO SI:   El hematoma o la hinchazn aumentan.  Siente dolor que [place]empeora.  La hinchazn o el dolor no se OGE Energyalivian con los medicamentos. ASEGRESE DE QUE:   Comprende estas instrucciones.  Controlar su afeccin.  Recibir ayuda de inmediato si no mejora o si empeora. Document Released: 05/15/2005 Document Revised: 08/10/2013 [facility] Patient Information 2015 [place], [place]. This information is not intended to replace advice given to you by your health care provider. Make sure you discuss any questions you have with your health care provider.

## 2015-10-25 ENCOUNTER — Encounter (HOSPITAL_COMMUNITY): Payer: Self-pay | Admitting: Vascular Surgery

## 2015-10-25 ENCOUNTER — Emergency Department (HOSPITAL_COMMUNITY)
Admission: EM | Admit: 2015-10-25 | Discharge: 2015-10-25 | Disposition: A | Discharge status: Home / Self Care | Payer: Worker's Compensation | Attending: Emergency Medicine | Admitting: Emergency Medicine

## 2015-10-25 DIAGNOSIS — X158XXA Contact with other hot household appliances, initial encounter: Secondary | ICD-10-CM | POA: Diagnosis not present

## 2015-10-25 DIAGNOSIS — Z791 Long term (current) use of non-steroidal anti-inflammatories (NSAID): Secondary | ICD-10-CM | POA: Insufficient documentation

## 2015-10-25 DIAGNOSIS — Y9389 Activity, other specified: Secondary | ICD-10-CM | POA: Insufficient documentation

## 2015-10-25 DIAGNOSIS — T22212A Burn of second degree of left forearm, initial encounter: Secondary | ICD-10-CM | POA: Diagnosis present

## 2015-10-25 DIAGNOSIS — G43909 Migraine, unspecified, not intractable, without status migrainosus: Secondary | ICD-10-CM | POA: Insufficient documentation

## 2015-10-25 DIAGNOSIS — T310 Burns involving less than 10% of body surface: Secondary | ICD-10-CM

## 2015-10-25 DIAGNOSIS — Y999 Unspecified external cause status: Secondary | ICD-10-CM | POA: Diagnosis not present

## 2015-10-25 DIAGNOSIS — Y92511 Restaurant or cafe as the place of occurrence of the external cause: Secondary | ICD-10-CM | POA: Insufficient documentation

## 2015-10-25 MED ORDER — SILVER SULFADIAZINE 1 % EX CREA
TOPICAL_CREAM | Freq: Once | CUTANEOUS | Status: AC
  Administered 2015-10-25: 18:00:00 via TOPICAL
  Filled 2015-10-25: qty 85

## 2015-10-25 MED ORDER — TRAMADOL HCL 50 MG PO TABS: 50.0000 mg | ORAL_TABLET | Freq: Two times a day (BID) | ORAL | Status: DC | PRN

## 2015-10-25 MED ORDER — SILVER SULFADIAZINE 1 % EX CREA: 1.0000 "application " | TOPICAL_CREAM | Freq: Every day | CUTANEOUS | Status: DC

## 2015-10-25 MED ORDER — TRAMADOL HCL 50 MG PO TABS
50.0000 mg | ORAL_TABLET | Freq: Once | ORAL | Status: AC
  Administered 2015-10-25: 50 mg via ORAL
  Filled 2015-10-25: qty 1
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
## 2015-10-25 NOTE — Discharge Instructions (Signed)
Cuidado de las quemaduras  (Burn Care)  La piel es una barrera natural que nos protege contra las infecciones. Es el órgano más grande de nuestro cuerpo. Como usted ha sufrido una quemadura, esta protección natural está dañada. Para ayudarlo a prevenir la infección, es muy importante que siga estas instrucciones para el cuidado de la quemadura.  Quemaduras se clasifican:  · de primer grado - sólo eritema o enrojecimiento de la piel. No es de esperar que se produzcan cicatrices.  · de segundo grado - se ampolla la piel. En las quemaduras profundas puede quedar una cicatriz.  · de tercer grado - destrucción de todas las capas de la piel y deja cicatrices.  INSTRUCCIONES PARA EL CUIDADO DOMICILIARIO  · Lávese bien las manos antes de cambiarse el vendaje.  · Cambie el vendaje con la frecuencia que se lo indique su médico.    Retire los vendajes viejos. Si se adhieren a la piel, para retirarlos puede remojarlos con agua fría y limpia.    Limpie minuciosamente, pero con cuidado con un jabón suave y agua.    Seque dando palmaditas con un paño limpio y seco.    Aplique una capa delgada de crema con antibiótico para la quemadura.    Aplíquese vendajes limpios como le indicó el profesional que lo asiste.    Mantenga el vendaje tan limpio y seco como sea posible.  · Eleve la zona afectada durante las primeras 24 horas, y luego según le haya indicado el profesional que lo asiste.  · Utilice los medicamentos de venta libre o de prescripción para el dolor, el malestar o la fiebre, según se lo indique el profesional que lo asiste.  SOLICITE ATENCIÓN MÉDICA DE INMEDIATO SI:  · El dolor es excesivo.  · En la zona quemada hay un aumento del enrojecimiento, sensibilidad, hinchazón o rayas rojas cerca de la quemadura.  · Aparece un líquido de color blanco amarillento (pus) en zona quemada, o ésta tiene mal olor.  · Tiene fiebre.  ESTÉ SEGURO QUE:  · Comprende las instrucciones para el alta médica.  · Controlará su  enfermedad.  · Solicitará atención médica de inmediato según las indicaciones.     Esta información no tiene como fin reemplazar el consejo del médico. Asegúrese de hacerle al médico cualquier pregunta que tenga.     Document Released: 08/05/2005 Document Revised: 10/28/2011  Elsevier Interactive Patient Education ©2016 Elsevier Inc.
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
## 2015-10-25 NOTE — ED Notes (Signed)
Pt ambulates independently and with steady gait at time of discharge. Discharge instructions and follow up information reviewed with patient. No other questions or concerns voiced at this time. RX x 2. 

## 2016-06-30 ENCOUNTER — Emergency Department (HOSPITAL_COMMUNITY)
Admission: EM | Admit: 2016-06-30 | Discharge: 2016-06-30 | Disposition: A | Discharge status: Home / Self Care | Payer: Worker's Compensation | Attending: Emergency Medicine | Admitting: Emergency Medicine

## 2016-06-30 ENCOUNTER — Emergency Department (HOSPITAL_COMMUNITY): Payer: Worker's Compensation

## 2016-06-30 ENCOUNTER — Encounter (HOSPITAL_COMMUNITY): Payer: Self-pay | Admitting: *Deleted

## 2016-06-30 DIAGNOSIS — Y939 Activity, unspecified: Secondary | ICD-10-CM | POA: Insufficient documentation

## 2016-06-30 DIAGNOSIS — S0990XA Unspecified injury of head, initial encounter: Secondary | ICD-10-CM | POA: Diagnosis present

## 2016-06-30 DIAGNOSIS — W228XXA Striking against or struck by other objects, initial encounter: Secondary | ICD-10-CM | POA: Diagnosis not present

## 2016-06-30 DIAGNOSIS — Y929 Unspecified place or not applicable: Secondary | ICD-10-CM | POA: Insufficient documentation

## 2016-06-30 DIAGNOSIS — Y990 Civilian activity done for income or pay: Secondary | ICD-10-CM | POA: Insufficient documentation

## 2016-06-30 MED ORDER — IBUPROFEN 200 MG PO TABS
600.0000 mg | ORAL_TABLET | Freq: Once | ORAL | Status: AC
  Administered 2016-06-30: 600 mg via ORAL
  Filled 2016-06-30: qty 1

## 2016-06-30 MED ORDER — IBUPROFEN 600 MG PO TABS: 600.0000 mg | ORAL_TABLET | Freq: Four times a day (QID) | ORAL | 0 refills | Status: AC | PRN

## 2016-06-30 MED ORDER — ONDANSETRON 4 MG PO TBDP
8.0000 mg | ORAL_TABLET | Freq: Once | ORAL | Status: AC
  Administered 2016-06-30: 8 mg via ORAL
  Filled 2016-06-30: qty 2

## 2016-06-30 MED ORDER — ONDANSETRON HCL 4 MG PO TABS: 4.0000 mg | ORAL_TABLET | Freq: Four times a day (QID) | ORAL | 0 refills | Status: AC
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
## 2016-06-30 NOTE — ED Notes (Signed)
Patient transported to CT 

## 2018-03-02 IMAGING — CT CT HEAD W/O CM
4 series · 17 of 47 positions shown, 19 images · non-contrast
Comparison: None

CLINICAL DATA: Headaches

EXAM:
CT HEAD WITHOUT CONTRAST
TECHNIQUE: Contiguous axial images were obtained from the base of the skull
through the vertex without intravenous contrast.

[Series 2: head without · axial · non-contrast · 0.39mm/px · z∈[-93,+22]mm · 7 of 31 slices shown, 9 images]
[im 4/31  brain]
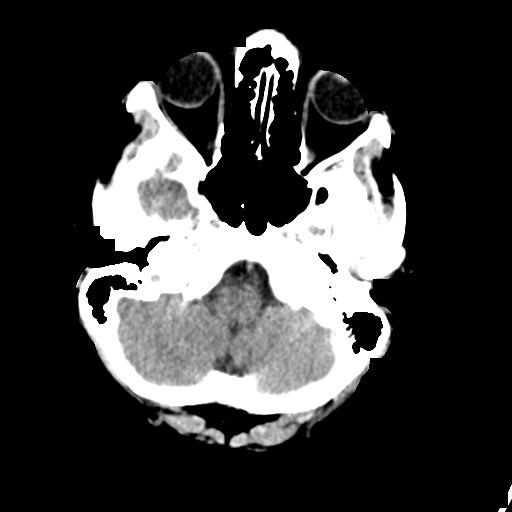
[im 4/31  bone]
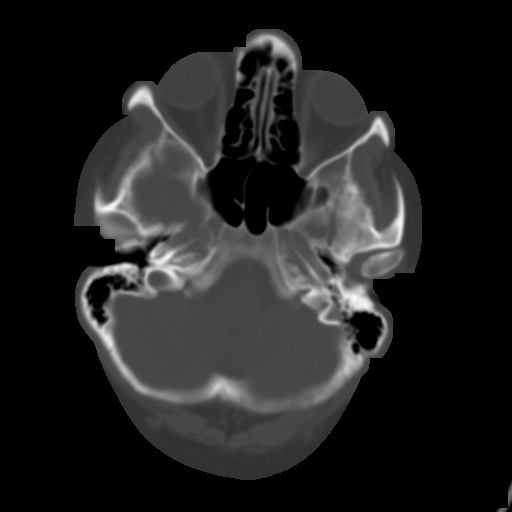
[im 8/31  brain]
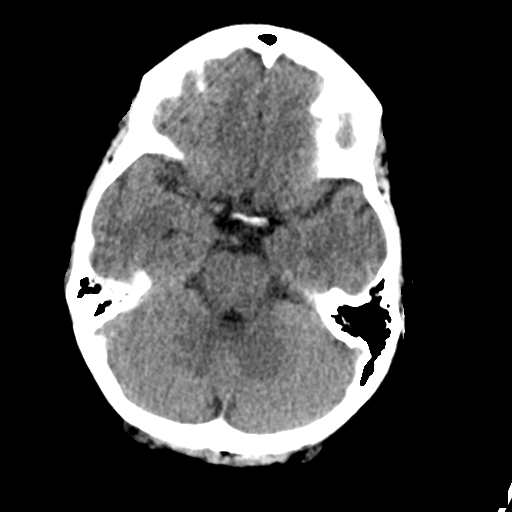
[im 12/31  brain]
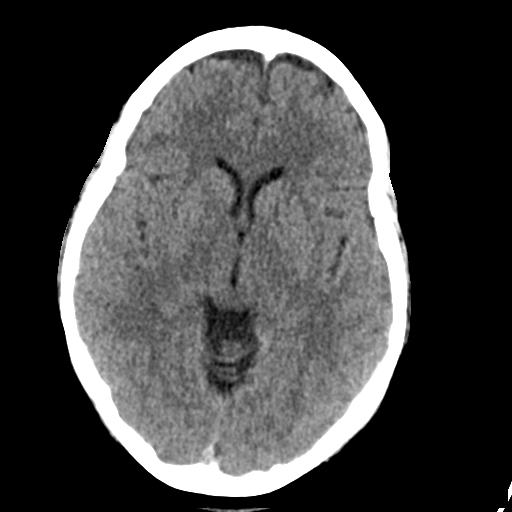
[im 16/31  brain]
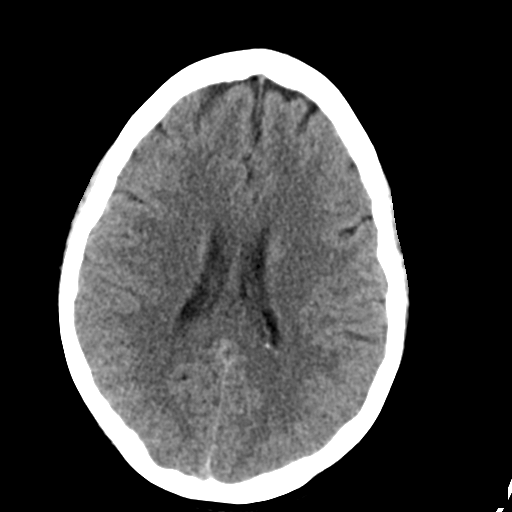
[im 19/31  brain]
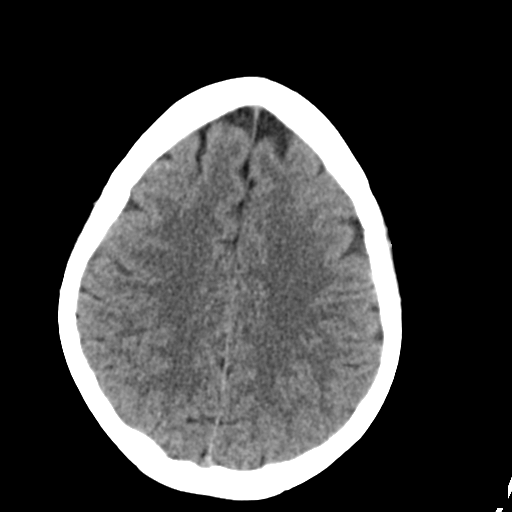
[im 19/31  bone]
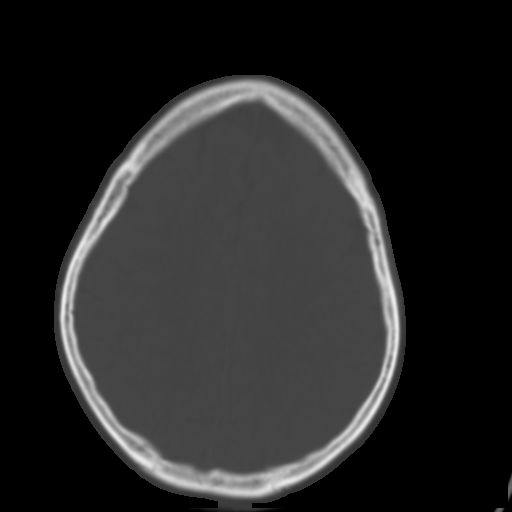
[im 23/31  brain]
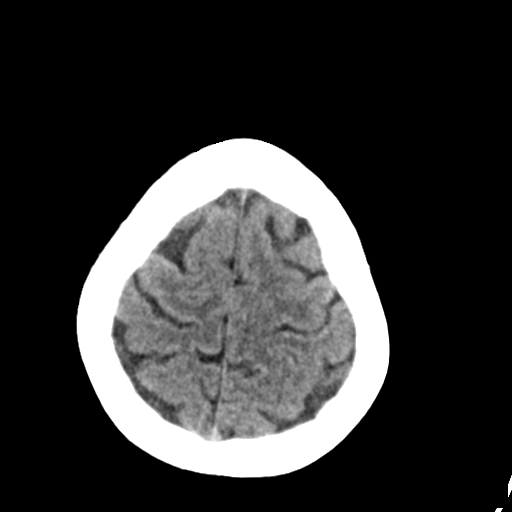
[im 27/31  brain]
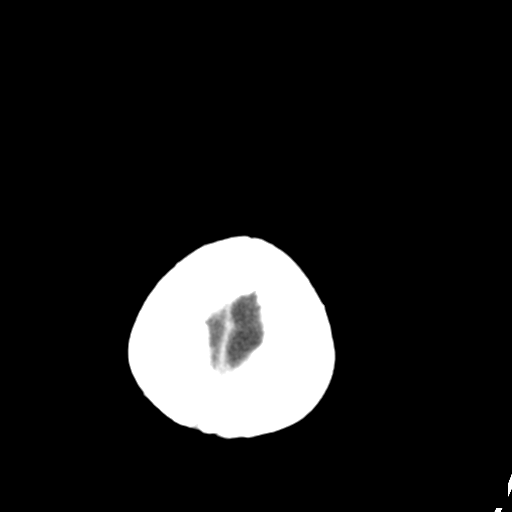

[Series 3: head bone · axial · 0.39mm/px · z∈[-94,-40]mm · 4 of 77 slices shown]
[im 8/77  bone]
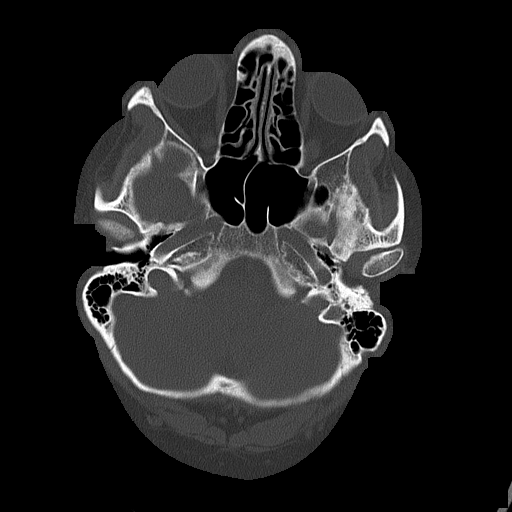
[im 16/77  bone]
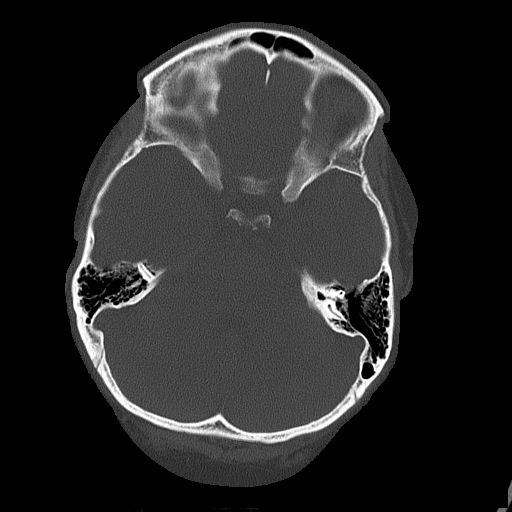
[im 23/77  bone]
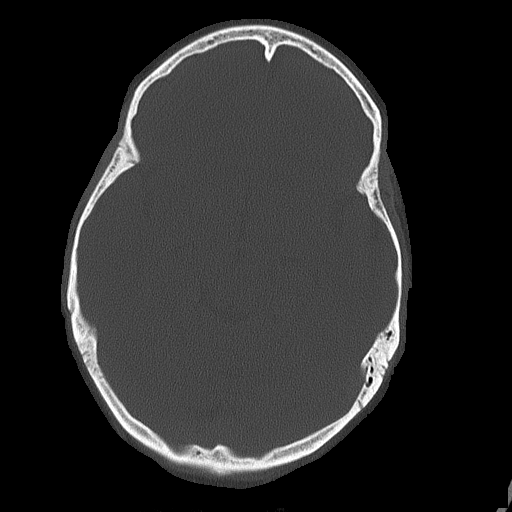
[im 35/77  bone]
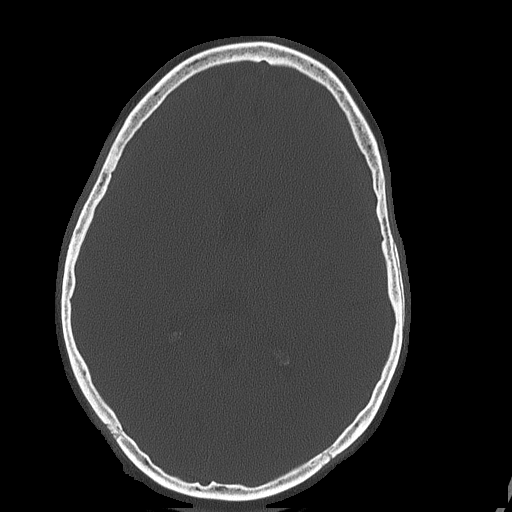

[Series 4: head without cor · coronal · non-contrast · 0.29mm/px · 3 of 67 slices shown]
[im 23/67  brain]
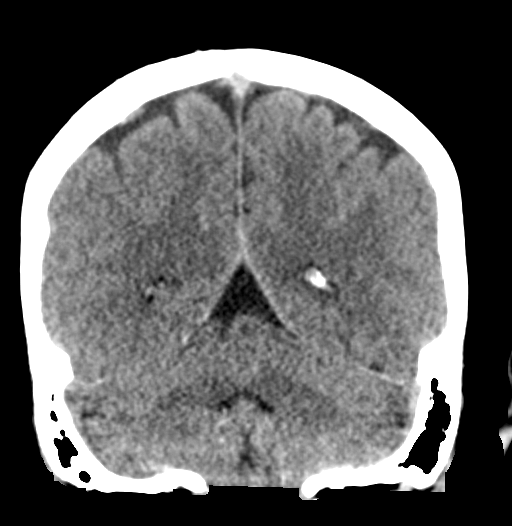
[im 30/67  brain]
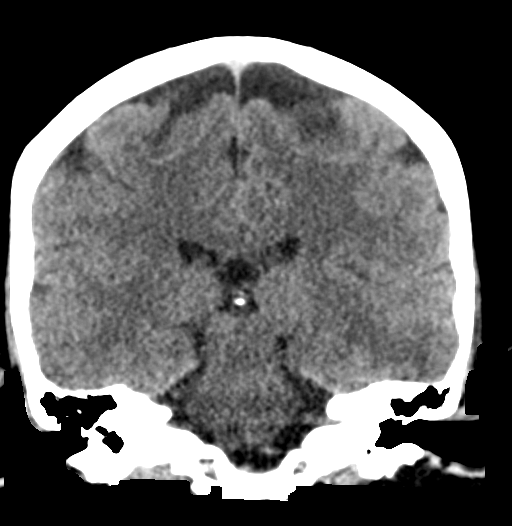
[im 37/67  brain]
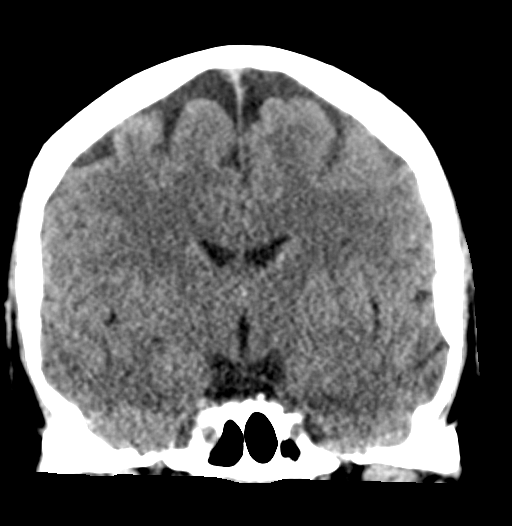

[Series 5: head without sag · sagittal · non-contrast · 0.30mm/px · 3 of 67 slices shown]
[im 23/67  brain]
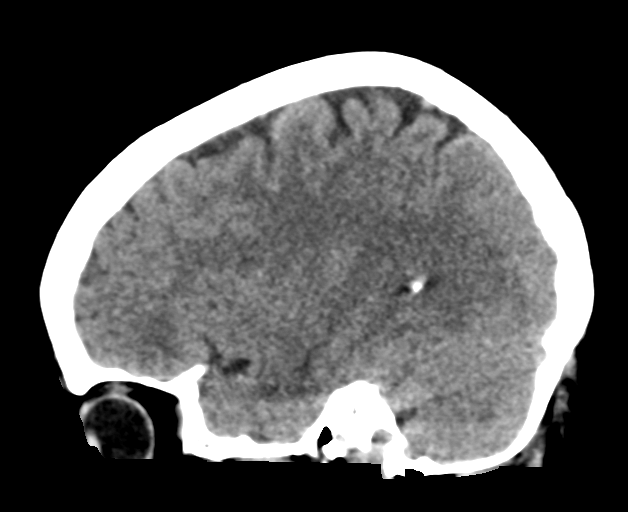
[im 34/67  brain]
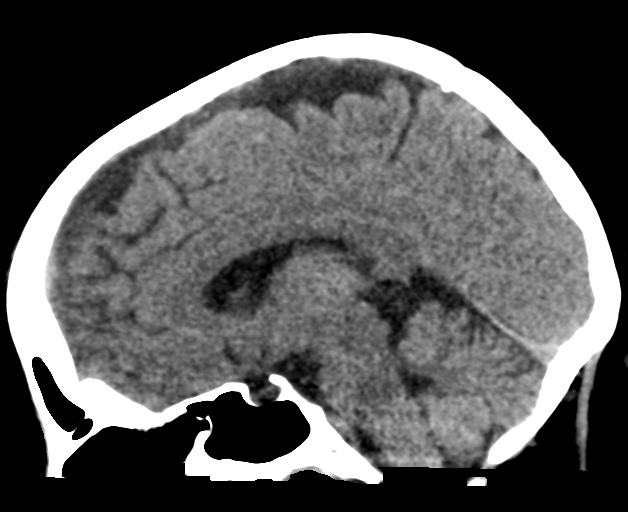
[im 45/67  brain]
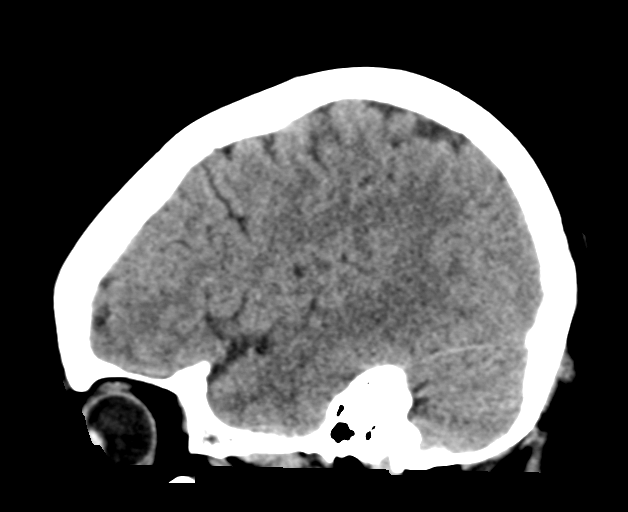

[17 of 47 positions shown; findings below may reference images not displayed]

FINDINGS: Brain: No acute cortical infarct, hemorrhage, or mass lesion
ispresent. Ventricles are of normal size. No significant extra-axial
fluid collection is present. The paranasal sinuses andmastoid air
cells are clear. The osseous skull is intact.

Vascular: No hyperdense vessel or unexpected calcification.

Skull: Normal. Negative for fracture or focal lesion.

Sinuses/Orbits: No acute finding.

Other: None.
IMPRESSION: 1. No acute intracranial abnormality.
2. Normal brain.
